# Patient Record
Sex: Female | Born: 1960 | Race: Black or African American | Hispanic: No | Smoking: Never smoker
Health system: Southern US, Community
[De-identification: ages and names within clinical notes are randomized; demographics above are authoritative.]

## PROBLEM LIST (undated history)

## (undated) DIAGNOSIS — F329 Major depressive disorder, single episode, unspecified: Secondary | ICD-10-CM

## (undated) DIAGNOSIS — K859 Acute pancreatitis without necrosis or infection, unspecified: Secondary | ICD-10-CM

## (undated) DIAGNOSIS — E669 Obesity, unspecified: Secondary | ICD-10-CM

## (undated) DIAGNOSIS — F32A Depression, unspecified: Secondary | ICD-10-CM

## (undated) DIAGNOSIS — E538 Deficiency of other specified B group vitamins: Secondary | ICD-10-CM

## (undated) DIAGNOSIS — K219 Gastro-esophageal reflux disease without esophagitis: Secondary | ICD-10-CM

## (undated) DIAGNOSIS — G4700 Insomnia, unspecified: Secondary | ICD-10-CM

## (undated) DIAGNOSIS — E039 Hypothyroidism, unspecified: Secondary | ICD-10-CM

## (undated) HISTORY — PX: APPENDECTOMY: SHX54

## (undated) HISTORY — DX: Insomnia, unspecified: G47.00

## (undated) HISTORY — DX: Hypothyroidism, unspecified: E03.9

## (undated) HISTORY — DX: Gastro-esophageal reflux disease without esophagitis: K21.9

## (undated) HISTORY — DX: Deficiency of other specified B group vitamins: E53.8

## (undated) HISTORY — DX: Major depressive disorder, single episode, unspecified: F32.9

## (undated) HISTORY — DX: Acute pancreatitis without necrosis or infection, unspecified: K85.90

## (undated) HISTORY — DX: Obesity, unspecified: E66.9

## (undated) HISTORY — PX: CHOLECYSTECTOMY: SHX55

## (undated) HISTORY — DX: Depression, unspecified: F32.A

---

## 2001-01-19 ENCOUNTER — Encounter: Payer: Self-pay | Admitting: Emergency Medicine

## 2001-01-19 ENCOUNTER — Inpatient Hospital Stay (HOSPITAL_COMMUNITY): Admission: EM | Admit: 2001-01-19 | Discharge: 2001-01-22 | Payer: Self-pay | Admitting: Emergency Medicine

## 2001-02-08 ENCOUNTER — Ambulatory Visit (HOSPITAL_COMMUNITY): Admission: RE | Admit: 2001-02-08 | Discharge: 2001-02-08 | Payer: Self-pay | Admitting: Surgery

## 2001-02-08 ENCOUNTER — Encounter: Payer: Self-pay | Admitting: Surgery

## 2001-02-23 ENCOUNTER — Encounter (INDEPENDENT_AMBULATORY_CARE_PROVIDER_SITE_OTHER): Payer: Self-pay

## 2001-02-24 ENCOUNTER — Inpatient Hospital Stay (HOSPITAL_COMMUNITY): Admission: EM | Admit: 2001-02-24 | Discharge: 2001-02-25 | Payer: Self-pay | Admitting: Surgery

## 2003-04-29 ENCOUNTER — Emergency Department (HOSPITAL_COMMUNITY): Admission: EM | Admit: 2003-04-29 | Discharge: 2003-04-29 | Payer: Self-pay | Admitting: *Deleted

## 2004-08-11 ENCOUNTER — Ambulatory Visit: Payer: Self-pay | Admitting: Internal Medicine

## 2004-08-15 ENCOUNTER — Ambulatory Visit: Payer: Self-pay | Admitting: Internal Medicine

## 2005-07-01 ENCOUNTER — Ambulatory Visit: Payer: Self-pay | Admitting: Internal Medicine

## 2006-09-14 ENCOUNTER — Ambulatory Visit: Payer: Self-pay | Admitting: Internal Medicine

## 2006-09-14 LAB — CONVERTED CEMR LAB
ALT: 19 units/L (ref 0–35)
AST: 17 units/L (ref 0–37)
Albumin: 3.1 g/dL — ABNORMAL LOW (ref 3.5–5.2)
Alkaline Phosphatase: 74 units/L (ref 39–117)
BUN: 8 mg/dL (ref 6–23)
Basophils Absolute: 0 10*3/uL (ref 0.0–0.1)
Basophils Relative: 0 % (ref 0.0–1.0)
Bilirubin Urine: NEGATIVE
Bilirubin, Direct: 0.1 mg/dL (ref 0.0–0.3)
CO2: 31 meq/L (ref 19–32)
Calcium: 8.9 mg/dL (ref 8.4–10.5)
Chloride: 108 meq/L (ref 96–112)
Creatinine, Ser: 0.6 mg/dL (ref 0.4–1.2)
Eosinophils Absolute: 0.1 10*3/uL (ref 0.0–0.6)
Eosinophils Relative: 0.6 % (ref 0.0–5.0)
FSH: 6.8 milliintl units/mL
GFR calc Af Amer: 138 mL/min
GFR calc non Af Amer: 114 mL/min
Glucose, Bld: 103 mg/dL — ABNORMAL HIGH (ref 70–99)
HCT: 31.3 % — ABNORMAL LOW (ref 36.0–46.0)
Hemoglobin, Urine: NEGATIVE
Hemoglobin: 10.5 g/dL — ABNORMAL LOW (ref 12.0–15.0)
Ketones, ur: NEGATIVE mg/dL
Leukocytes, UA: NEGATIVE
Lymphocytes Relative: 42.9 % (ref 12.0–46.0)
MCHC: 33.5 g/dL (ref 30.0–36.0)
MCV: 81.7 fL (ref 78.0–100.0)
Monocytes Absolute: 1.3 10*3/uL — ABNORMAL HIGH (ref 0.2–0.7)
Monocytes Relative: 12.4 % — ABNORMAL HIGH (ref 3.0–11.0)
Neutro Abs: 4.5 10*3/uL (ref 1.4–7.7)
Neutrophils Relative %: 44.1 % (ref 43.0–77.0)
Nitrite: NEGATIVE
Platelets: 707 10*3/uL — ABNORMAL HIGH (ref 150–400)
Potassium: 3.8 meq/L (ref 3.5–5.1)
RBC: 3.83 M/uL — ABNORMAL LOW (ref 3.87–5.11)
RDW: 18.7 % — ABNORMAL HIGH (ref 11.5–14.6)
Sodium: 141 meq/L (ref 135–145)
Specific Gravity, Urine: 1.025 (ref 1.000–1.03)
TSH: 4.26 microintl units/mL (ref 0.35–5.50)
Total Bilirubin: 0.5 mg/dL (ref 0.3–1.2)
Total Protein, Urine: NEGATIVE mg/dL
Total Protein: 7.1 g/dL (ref 6.0–8.3)
Urine Glucose: NEGATIVE mg/dL
Urobilinogen, UA: 0.2 (ref 0.0–1.0)
WBC: 10.3 10*3/uL (ref 4.5–10.5)
pH: 6 (ref 5.0–8.0)

## 2007-03-10 DIAGNOSIS — E039 Hypothyroidism, unspecified: Secondary | ICD-10-CM

## 2007-03-10 DIAGNOSIS — E538 Deficiency of other specified B group vitamins: Secondary | ICD-10-CM

## 2007-03-10 HISTORY — DX: Deficiency of other specified B group vitamins: E53.8

## 2007-03-10 HISTORY — DX: Hypothyroidism, unspecified: E03.9

## 2007-05-20 ENCOUNTER — Encounter: Payer: Self-pay | Admitting: Internal Medicine

## 2007-06-14 ENCOUNTER — Ambulatory Visit: Payer: Self-pay | Admitting: Internal Medicine

## 2007-06-14 DIAGNOSIS — F32A Depression, unspecified: Secondary | ICD-10-CM | POA: Insufficient documentation

## 2007-06-14 DIAGNOSIS — F329 Major depressive disorder, single episode, unspecified: Secondary | ICD-10-CM

## 2007-06-14 DIAGNOSIS — E538 Deficiency of other specified B group vitamins: Secondary | ICD-10-CM | POA: Insufficient documentation

## 2007-06-14 DIAGNOSIS — R5383 Other fatigue: Secondary | ICD-10-CM

## 2007-06-14 DIAGNOSIS — R209 Unspecified disturbances of skin sensation: Secondary | ICD-10-CM

## 2007-06-14 DIAGNOSIS — R5381 Other malaise: Secondary | ICD-10-CM

## 2007-06-14 DIAGNOSIS — R635 Abnormal weight gain: Secondary | ICD-10-CM

## 2007-06-15 DIAGNOSIS — E039 Hypothyroidism, unspecified: Secondary | ICD-10-CM | POA: Insufficient documentation

## 2007-06-16 LAB — CONVERTED CEMR LAB
ALT: 14 units/L (ref 0–35)
AST: 18 units/L (ref 0–37)
Albumin: 3.4 g/dL — ABNORMAL LOW (ref 3.5–5.2)
Alkaline Phosphatase: 67 units/L (ref 39–117)
BUN: 7 mg/dL (ref 6–23)
Basophils Absolute: 0 10*3/uL (ref 0.0–0.1)
Basophils Relative: 0.1 % (ref 0.0–1.0)
Bilirubin Urine: NEGATIVE
Bilirubin, Direct: 0.1 mg/dL (ref 0.0–0.3)
CO2: 29 meq/L (ref 19–32)
Calcium: 8.6 mg/dL (ref 8.4–10.5)
Chloride: 106 meq/L (ref 96–112)
Creatinine, Ser: 0.7 mg/dL (ref 0.4–1.2)
Eosinophils Absolute: 0.1 10*3/uL (ref 0.0–0.7)
Eosinophils Relative: 1.3 % (ref 0.0–5.0)
GFR calc Af Amer: 115 mL/min
GFR calc non Af Amer: 95 mL/min
Glucose, Bld: 87 mg/dL (ref 70–99)
HCT: 30.9 % — ABNORMAL LOW (ref 36.0–46.0)
Hemoglobin, Urine: NEGATIVE
Hemoglobin: 9.8 g/dL — ABNORMAL LOW (ref 12.0–15.0)
Ketones, ur: NEGATIVE mg/dL
Leukocytes, UA: NEGATIVE
Lymphocytes Relative: 62.2 % — ABNORMAL HIGH (ref 12.0–46.0)
MCHC: 31.8 g/dL (ref 30.0–36.0)
MCV: 80.5 fL (ref 78.0–100.0)
Monocytes Absolute: 0.8 10*3/uL (ref 0.1–1.0)
Monocytes Relative: 8.8 % (ref 3.0–12.0)
Neutro Abs: 2.8 10*3/uL (ref 1.4–7.7)
Neutrophils Relative %: 27.6 % — ABNORMAL LOW (ref 43.0–77.0)
Nitrite: NEGATIVE
Platelets: 717 10*3/uL — ABNORMAL HIGH (ref 150–400)
Potassium: 3.3 meq/L — ABNORMAL LOW (ref 3.5–5.1)
RBC: 3.84 M/uL — ABNORMAL LOW (ref 3.87–5.11)
RDW: 17.4 % — ABNORMAL HIGH (ref 11.5–14.6)
Sodium: 138 meq/L (ref 135–145)
Specific Gravity, Urine: 1.02 (ref 1.000–1.03)
TSH: 5.43 microintl units/mL (ref 0.35–5.50)
Total Bilirubin: 0.5 mg/dL (ref 0.3–1.2)
Total Protein: 7.7 g/dL (ref 6.0–8.3)
Urine Glucose: NEGATIVE mg/dL
Urobilinogen, UA: 0.2 (ref 0.0–1.0)
Vitamin B-12: 147 pg/mL — ABNORMAL LOW (ref 211–911)
WBC: 10.2 10*3/uL (ref 4.5–10.5)
pH: 7 (ref 5.0–8.0)

## 2007-06-20 ENCOUNTER — Ambulatory Visit: Payer: Self-pay | Admitting: Internal Medicine

## 2007-06-20 DIAGNOSIS — E559 Vitamin D deficiency, unspecified: Secondary | ICD-10-CM

## 2007-09-06 ENCOUNTER — Ambulatory Visit: Payer: Self-pay | Admitting: Internal Medicine

## 2007-12-12 ENCOUNTER — Ambulatory Visit: Payer: Self-pay | Admitting: Internal Medicine

## 2007-12-12 LAB — CONVERTED CEMR LAB
BUN: 7 mg/dL (ref 6–23)
CO2: 30 meq/L (ref 19–32)
Calcium: 9 mg/dL (ref 8.4–10.5)
Chloride: 106 meq/L (ref 96–112)
Creatinine, Ser: 0.7 mg/dL (ref 0.4–1.2)
GFR calc non Af Amer: 95 mL/min

## 2008-04-18 ENCOUNTER — Encounter: Payer: Self-pay | Admitting: Internal Medicine

## 2008-05-01 ENCOUNTER — Telehealth: Payer: Self-pay | Admitting: Internal Medicine

## 2008-05-24 ENCOUNTER — Telehealth: Payer: Self-pay | Admitting: Internal Medicine

## 2008-08-09 ENCOUNTER — Emergency Department (HOSPITAL_COMMUNITY): Admission: EM | Admit: 2008-08-09 | Discharge: 2008-08-09 | Payer: Self-pay | Admitting: Emergency Medicine

## 2008-09-05 ENCOUNTER — Ambulatory Visit: Payer: Self-pay | Admitting: Internal Medicine

## 2008-09-05 DIAGNOSIS — K047 Periapical abscess without sinus: Secondary | ICD-10-CM | POA: Insufficient documentation

## 2008-09-17 ENCOUNTER — Telehealth: Payer: Self-pay | Admitting: Internal Medicine

## 2008-10-08 ENCOUNTER — Encounter: Payer: Self-pay | Admitting: Internal Medicine

## 2009-03-09 DIAGNOSIS — K859 Acute pancreatitis without necrosis or infection, unspecified: Secondary | ICD-10-CM

## 2009-03-09 HISTORY — DX: Acute pancreatitis without necrosis or infection, unspecified: K85.90

## 2009-03-28 ENCOUNTER — Ambulatory Visit: Payer: Self-pay | Admitting: Internal Medicine

## 2009-03-28 DIAGNOSIS — R079 Chest pain, unspecified: Secondary | ICD-10-CM

## 2009-03-28 DIAGNOSIS — R29818 Other symptoms and signs involving the nervous system: Secondary | ICD-10-CM | POA: Insufficient documentation

## 2009-07-01 ENCOUNTER — Ambulatory Visit: Payer: Self-pay | Admitting: Internal Medicine

## 2009-07-01 DIAGNOSIS — Z8719 Personal history of other diseases of the digestive system: Secondary | ICD-10-CM | POA: Insufficient documentation

## 2009-07-01 DIAGNOSIS — K219 Gastro-esophageal reflux disease without esophagitis: Secondary | ICD-10-CM

## 2009-07-03 LAB — CONVERTED CEMR LAB
ALT: 30 units/L (ref 0–35)
AST: 29 units/L (ref 0–37)
Albumin: 3.4 g/dL — ABNORMAL LOW (ref 3.5–5.2)
Basophils Relative: 0.5 % (ref 0.0–3.0)
Eosinophils Relative: 1.7 % (ref 0.0–5.0)
HCT: 31.6 % — ABNORMAL LOW (ref 36.0–46.0)
Lipase: 2 units/L — ABNORMAL LOW (ref 11.0–59.0)
Lymphs Abs: 4 10*3/uL (ref 0.7–4.0)
MCV: 80.8 fL (ref 78.0–100.0)
Monocytes Absolute: 0.8 10*3/uL (ref 0.1–1.0)
RBC: 3.91 M/uL (ref 3.87–5.11)
Total Protein: 7.3 g/dL (ref 6.0–8.3)
WBC: 7.7 10*3/uL (ref 4.5–10.5)

## 2009-07-09 ENCOUNTER — Telehealth: Payer: Self-pay | Admitting: Internal Medicine

## 2009-07-09 DIAGNOSIS — L709 Acne, unspecified: Secondary | ICD-10-CM | POA: Insufficient documentation

## 2009-07-16 ENCOUNTER — Encounter: Payer: Self-pay | Admitting: Internal Medicine

## 2009-07-17 ENCOUNTER — Encounter: Payer: Self-pay | Admitting: Internal Medicine

## 2009-09-13 ENCOUNTER — Ambulatory Visit: Payer: Self-pay | Admitting: Internal Medicine

## 2010-04-08 NOTE — Progress Notes (Signed)
Summary: Treinoin Cream PA  Phone Note From Pharmacy   Details of Request: ID:  [id number] Summary of Call: PA request--Tretinoin. Form requested. Initial call taken by: Lucious Groves,  Jul 09, 2009 9:49 AM  Follow-up for Phone Call        prior authorization completed and faxed, will await insurance company reply. Follow-up by: Lucious Groves,  Jul 11, 2009 11:59 AM  New Problems: ACNE VULGARIS, FACIAL (ICD-706.1)   New Problems: ACNE VULGARIS, FACIAL (ICD-706.1)     Appended Document: Treinoin Cream PA approved until 2012.

## 2010-04-08 NOTE — Medication Information (Signed)
Summary: Prior Authorization [employer] / [employer]  Prior Authorization [employer] / [employer]   Imported By: Lennie Odor 07/16/2009 15:34:04  _____________________________________________________________________  External Attachment:    Type:   Image     Comment:   External Document

## 2010-04-08 NOTE — Assessment & Plan Note (Signed)
Summary: post hospital/pancreatitis/ok per plot/lb   Vital Signs:  Patient profile:   50 year old female Height:      66.5 inches Weight:      258 pounds BMI:     41.17 Temp:     97.8 degrees F oral Pulse rate:   99 / minute BP sitting:   122 / 84  (right arm)  Vitals Entered By: Lamar Sprinkles, CMA (July 01, 2009 8:08 AM) CC: F/U   Primary Care Provider:  Tresa Garter MD  CC:  F/U.  History of Present Illness: She was diagnosed w/pancreatitis in [place] 3 wks ago. She had a CT of abd - no GS. C/o stress and depression. C/o L flank pain. She was given Vicodin. Pain is moderate - worse with ETOH, fried food. Took  a lot of Motrin. Was having 2 drinks on Fridays.  Current Medications (verified): 1)  Fluoxetine Hcl 20 Mg  Tabs (Fluoxetine Hcl) .... Take 1 Tab By Mouth Daily 2)  Vitamin D3 1000 Unit  Tabs (Cholecalciferol) .Marland Kitchen.. 1 By Mouth Daily 3)  Vitamin B-12 Cr 1000 Mcg  Tbcr (Cyanocobalamin) .... 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  The patient denies fever, dyspnea on exertion, and abdominal pain.    Physical Exam  General:   in no acute distress overweight-appearing.   Nose:  no external deformity, no external erythema, and no nasal discharge.   Mouth:  Oral mucosa and oropharynx without lesions or exudates.  Teeth in good repair. Lungs:  normal respiratory effort, no intercostal  retractions, no accessory muscle use, normal breath sounds, no dullness, no fremitus, no crackles, and no wheezes.   Heart:  normal rate, regular rhythm, no murmur, no gallop, no rub, and no JVD.   Abdomen:  soft, non-tender, normal bowel sounds, no distention, no masses, no guarding, no rigidity, no hepatomegaly, and no splenomegaly.   Msk:  normal ROM, no joint tenderness, no joint swelling, no joint warmth, no redness over joints, no joint deformities, no joint instability, and no crepitation.   Neurologic:  No cranial nerve deficits noted. Station and gait are normal. Plantar reflexes are down-going bilaterally. DTRs are symmetrical throughout. Sensory, motor and coordinative functions appear intact. Skin:  turgor normal, color normal, no rashes, no suspicious lesions, no ecchymoses, no petechiae, no purpura, no ulcerations, and no edema.   Psych:  Oriented X3, memory intact for recent and remote, normally interactive, good eye contact, not anxious appearing, and not depressed appearing.     Impression & Recommendations:  Problem # 1:  PANCREATITIS, HX OF (ICD-V12.70) Assessment New She had a CT. No GS. No ETOH lately.  Problem # 2:  WEIGHT GAIN, ABNORMAL (ICD-783.1) Assessment: Deteriorated  Orders: TLB-CBC Platelet - w/Differential (85025-CBCD) TLB-B12, Serum-Total ONLY (13086-V78) TLB-Hepatic/Liver Function Pnl (80076-HEPATIC) TLB-Lipase (83690-LIPASE) TLB-TSH (Thyroid Stimulating Hormone) (84443-TSH) T-Vitamin D (25-Hydroxy) (46962-95284) T-Helicobacter AB - IgG (13244-01027)  Problem # 3:  VITAMIN D DEFICIENCY (ICD-268.9) Assessment: Unchanged On prescription/OTC  therapy  Problem # 4:  B12 DEFICIENCY (ICD-266.2) Assessment: Deteriorated On prescription/OTC  therapy  Orders: TLB-CBC Platelet - w/Differential (85025-CBCD) TLB-B12, Serum-Total ONLY (16109-U04) TLB-Hepatic/Liver Function Pnl (80076-HEPATIC) TLB-Lipase (83690-LIPASE) TLB-TSH (Thyroid Stimulating Hormone)  (84443-TSH) T-Vitamin D (25-Hydroxy) (54098-11914) T-Helicobacter AB - IgG (78295-62130) Admin of Therapeutic Inj  intramuscular or subcutaneous (86578) Vit B12 1000 mcg (J3420)  Problem # 5:  HYPOTHYROIDISM (ICD-244.9) Assessment: Comment Only  Problem # 6:  GERD (ICD-530.81) Assessment: Deteriorated  Her updated medication list for this problem includes:    Protonix 40 Mg Tbec (Pantoprazole sodium) .Marland Kitchen... 1 by mouth qam for indigestion  Orders: TLB-CBC Platelet - w/Differential (85025-CBCD) TLB-B12, Serum-Total ONLY (46962-X52) TLB-Hepatic/Liver Function Pnl (80076-HEPATIC) TLB-Lipase (83690-LIPASE) TLB-TSH (Thyroid Stimulating Hormone) (84443-TSH) T-Vitamin D (25-Hydroxy) (84132-44010) T-Helicobacter AB - IgG (27253-66440)  Complete Medication List: 1)  Fluoxetine Hcl 20 Mg Tabs (Fluoxetine hcl) .... Take 1 tab by mouth daily 2)  Vitamin D3 1000 Unit Tabs (Cholecalciferol) .Marland Kitchen.. 1 by mouth daily 3)  Vitamin B-12 Cr 1000 Mcg Tbcr (Cyanocobalamin) .... Take one tablet by mouth daily 4)  Tramadol Hcl 50 Mg Tabs (Tramadol hcl) .Marland Kitchen.. 1-2 tabs by mouth two times a day as needed pain 5)  Protonix 40 Mg Tbec (Pantoprazole sodium) .Marland Kitchen.. 1 by mouth qam for indigestion 6)  Tretinoin 0.025 % Crea (Tretinoin) .... Use once daily on face  Patient Instructions: 1)  Please schedule a follow-up appointment in 6 months. 2)  See a gastroenterolodist 3)  No alcohol 4)  Loose weight. Low fat diet. 5)  Try to eat more raw plant food, fresh and dry fruit, raw almonds, leafy vegetables, whole foods and less red meat, less animal fat. Poultry and fish is better for you than pork and beef. Avoid processed foods (canned soups, hot dogs, sausage, bacon , frozen dinners). Avoid corn syrup, high fructose syrup or aspartam and Splenda  containing drinks. Honey, Agave and Stevia are better sweeteners. Make your own  dressing with olive oil, wine vinegar, lemon juce, garlic etc. for your salads.   Prescriptions: FLUOXETINE HCL 20 MG  TABS (FLUOXETINE HCL) Take 1 tab by mouth daily  #90 Capsule x 1   Entered and Authorized by:   Tresa Garter MD   Signed by:   Tresa Garter MD on 07/01/2009   Method used:   Print then Give to Patient   RxID:   [id number] TRETINOIN 0.025 % CREA (TRETINOIN) use once daily on face  #45 g x 6   Entered and Authorized by:   Tresa Garter MD   Signed by:   Tresa Garter MD on 07/01/2009   Method used:   Print then Give to Patient   RxID:   [id number] PROTONIX 40 MG TBEC (PANTOPRAZOLE SODIUM) 1 by mouth qam for indigestion  #90 x 3   Entered and Authorized by:   Tresa Garter MD   Signed by:   Tresa Garter MD on 07/01/2009   Method used:   Print then Give to Patient   RxID:   [id number] TRAMADOL HCL 50 MG TABS (TRAMADOL HCL) 1-2 tabs by mouth two times a day as needed pain  #120 x 3   Entered and Authorized by:   Tresa Garter MD   Signed by:   Tresa Garter MD on 07/01/2009   Method used:   Print then Give to Patient   RxID:   [id number]     Not Administered:    Influenza Vaccine not given due  to: declined   Medication Administration  Injection # 1:    Medication: Vit B12 1000 mcg    Diagnosis: B12 DEFICIENCY (ICD-266.2)    Route: IM    Site: L deltoid    Exp Date: 03/10/2011    Lot #: [id number]    Mfr: [employer]    Patient tolerated injection without complications    Given by: Lamar Sprinkles, CMA (July 01, 2009 10:14 AM)  Orders Added: 1)  TLB-CBC Platelet - w/Differential [85025-CBCD] 2)  TLB-B12, Serum-Total ONLY [82607-B12] 3)  TLB-Hepatic/Liver Function Pnl [80076-HEPATIC] 4)  TLB-Lipase [83690-LIPASE] 5)  TLB-TSH (Thyroid Stimulating Hormone) [84443-TSH] 6)  T-Vitamin D (25-Hydroxy) [52841-32440] 7)  T-Helicobacter AB - IgG [10272-53664] 8)  Admin of Therapeutic Inj  intramuscular or subcutaneous [96372] 9)  Vit B12 1000 mcg [J3420] 10)  Est. Patient  Level IV [40347]
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
## 2010-04-08 NOTE — Medication Information (Signed)
Summary: Approved/[employer]  Approved/[employer]   Imported By: Lester  07/22/2009 08:12:47  _____________________________________________________________________  External Attachment:    Type:   Image     Comment:   External Document

## 2010-05-26 ENCOUNTER — Encounter: Payer: Self-pay | Admitting: Internal Medicine

## 2010-05-26 ENCOUNTER — Other Ambulatory Visit (INDEPENDENT_AMBULATORY_CARE_PROVIDER_SITE_OTHER): Payer: BC Managed Care – PPO | Admitting: Internal Medicine

## 2010-05-26 ENCOUNTER — Ambulatory Visit (INDEPENDENT_AMBULATORY_CARE_PROVIDER_SITE_OTHER): Payer: BC Managed Care – PPO | Admitting: Internal Medicine

## 2010-05-26 DIAGNOSIS — E559 Vitamin D deficiency, unspecified: Secondary | ICD-10-CM

## 2010-05-26 DIAGNOSIS — F411 Generalized anxiety disorder: Secondary | ICD-10-CM | POA: Insufficient documentation

## 2010-05-26 DIAGNOSIS — E039 Hypothyroidism, unspecified: Secondary | ICD-10-CM

## 2010-05-26 DIAGNOSIS — E538 Deficiency of other specified B group vitamins: Secondary | ICD-10-CM

## 2010-05-26 DIAGNOSIS — N920 Excessive and frequent menstruation with regular cycle: Secondary | ICD-10-CM

## 2010-05-26 DIAGNOSIS — F419 Anxiety disorder, unspecified: Secondary | ICD-10-CM | POA: Insufficient documentation

## 2010-05-26 DIAGNOSIS — J069 Acute upper respiratory infection, unspecified: Secondary | ICD-10-CM | POA: Insufficient documentation

## 2010-05-27 ENCOUNTER — Ambulatory Visit: Payer: BC Managed Care – PPO

## 2010-05-27 DIAGNOSIS — F411 Generalized anxiety disorder: Secondary | ICD-10-CM

## 2010-05-27 DIAGNOSIS — E039 Hypothyroidism, unspecified: Secondary | ICD-10-CM

## 2010-05-27 DIAGNOSIS — N920 Excessive and frequent menstruation with regular cycle: Secondary | ICD-10-CM

## 2010-05-27 DIAGNOSIS — E559 Vitamin D deficiency, unspecified: Secondary | ICD-10-CM

## 2010-05-27 DIAGNOSIS — E538 Deficiency of other specified B group vitamins: Secondary | ICD-10-CM

## 2010-05-27 LAB — CBC WITH DIFFERENTIAL/PLATELET
Basophils Absolute: 0.4 10*3/uL — ABNORMAL HIGH (ref 0.0–0.1)
Basophils Relative: 4.9 % — ABNORMAL HIGH (ref 0.0–3.0)
Eosinophils Absolute: 0.1 10*3/uL (ref 0.0–0.7)
Lymphocytes Relative: 41.1 % (ref 12.0–46.0)
MCHC: 32.4 g/dL (ref 30.0–36.0)
MCV: 74.1 fl — ABNORMAL LOW (ref 78.0–100.0)
Monocytes Absolute: 1 10*3/uL (ref 0.1–1.0)
Neutro Abs: 3.3 10*3/uL (ref 1.4–7.7)
Neutrophils Relative %: 40.6 % — ABNORMAL LOW (ref 43.0–77.0)
RBC: 4.02 Mil/uL (ref 3.87–5.11)
RDW: 19.3 % — ABNORMAL HIGH (ref 11.5–14.6)

## 2010-05-27 LAB — HEPATIC FUNCTION PANEL
Albumin: 3.4 g/dL — ABNORMAL LOW (ref 3.5–5.2)
Bilirubin, Direct: 0.1 mg/dL (ref 0.0–0.3)
Total Protein: 7.7 g/dL (ref 6.0–8.3)

## 2010-05-27 LAB — BASIC METABOLIC PANEL
CO2: 27 mEq/L (ref 19–32)
Calcium: 8.7 mg/dL (ref 8.4–10.5)
Chloride: 106 mEq/L (ref 96–112)
Creatinine, Ser: 0.8 mg/dL (ref 0.4–1.2)
Glucose, Bld: 97 mg/dL (ref 70–99)

## 2010-05-27 LAB — VITAMIN B12: Vitamin B-12: 703 pg/mL (ref 211–911)

## 2010-05-27 LAB — TSH: TSH: 4.06 u[IU]/mL (ref 0.35–5.50)

## 2010-05-28 ENCOUNTER — Telehealth: Payer: Self-pay | Admitting: *Deleted

## 2010-05-28 ENCOUNTER — Telehealth: Payer: Self-pay | Admitting: Internal Medicine

## 2010-05-28 ENCOUNTER — Other Ambulatory Visit: Payer: Self-pay | Admitting: Internal Medicine

## 2010-05-28 LAB — URINALYSIS, ROUTINE W REFLEX MICROSCOPIC
Specific Gravity, Urine: 1.02 (ref 1.000–1.030)
Total Protein, Urine: NEGATIVE
Urine Glucose: NEGATIVE
pH: 6 (ref 5.0–8.0)

## 2010-05-28 MED ORDER — ERGOCALCIFEROL 1.25 MG (50000 UT) PO CAPS: 50000.0000 [IU] | ORAL_CAPSULE | ORAL | Status: DC

## 2010-05-28 NOTE — Telephone Encounter (Signed)
See Vit D results....unable to contact pt.

## 2010-05-28 NOTE — Telephone Encounter (Signed)
Tried to call pt to advise vit D is low and to inform new rx was sent in. No working numbers for patient.

## 2010-05-29 ENCOUNTER — Telehealth: Payer: Self-pay | Admitting: *Deleted

## 2010-05-29 NOTE — Telephone Encounter (Signed)
Message copied by Lanier Prude on Thu May 29, 2010  8:12 AM ------      Message from: Sonda Primes      Created: Wed May 28, 2010  5:47 PM       Stace 2 hery please inform the patient that her labs are normal except for low vitamin D I think you have already a cold in vitamin D for her       thank you

## 2010-05-29 NOTE — Telephone Encounter (Signed)
Multiple unsuccessful attempts to contact pt.Vit D sent in previously per Dr. Posey Rea. Will attempt to contact pt again later.Marland KitchenMarland KitchenI tried calling pt's work, cell and home # and her emergency contact #.

## 2010-05-30 NOTE — Telephone Encounter (Signed)
Attempted to contact pt again today... Still unable.  Will mail letter to pt re: recent labs

## 2010-06-05 NOTE — Assessment & Plan Note (Signed)
Summary: last of 2011/requests anxiety med/#/cd (mother made appt--not...   Vital Signs:  Patient profile:   50 year old female Height:      66.5 inches Weight:      283 pounds BMI:     45.16 Temp:     100.5 degrees F oral Pulse rate:   92 / minute Pulse rhythm:   regular Resp:     16 per minute BP sitting:   114 / 74  (left arm) Cuff size:   large  Vitals Entered By: Lanier Prude, Beverly Gust) (May 26, 2010 4:59 PM) CC: c/o fatigue, anxiety and stress Is Patient Diabetic? No Comments she is req rf on Fluoxetine and Lorazepam   Primary Care Provider:  Georgina Quint Plotnikov MD  CC:  c/o fatigue and anxiety and stress.  History of Present Illness: The patient presents for a follow up of back pain, anxiety, vit B12, depression and headaches. C/o stress.  The patient presents with complaints of sore throat, fever, cough, sinus congestion and drainge of several days duration. Not better with OTC meds. Chest hurts with coughing. Can't sleep due to cough. Muscle aches are present.  The mucus is colored.  C/o irreg period now after 3 wks of no period  Current Medications (verified): 1)  Fluoxetine Hcl 20 Mg  Tabs (Fluoxetine Hcl) .... Take 1 Tab By Mouth Daily 2)  Vitamin D3 1000 Unit  Tabs (Cholecalciferol) .Marland Kitchen.. 1 By Mouth Daily 3)  Vitamin B-12 Cr 1000 Mcg  Tbcr (Cyanocobalamin) .... Take One Tablet By Mouth Daily 4)  Tramadol Hcl 50 Mg Tabs (Tramadol Hcl) .Marland Kitchen.. 1-2 Tabs By Mouth Two Times A Day As Needed Pain 5)  Protonix 40 Mg Tbec (Pantoprazole Sodium) .Marland Kitchen.. 1 By Mouth Qam For Indigestion 6)  Tretinoin 0.025 % Crea (Tretinoin) .... 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    Physical Exam  General:   in no acute distress overweight-appearing.   Ears:  R ear normal and L ear normal.   Nose:  no external deformity, no external erythema, and no nasal discharge.   Mouth:  Oral mucosa and oropharynx without lesions or exudates.  Teeth in good repair. Lungs:  normal respiratory effort, no intercostal retractions, no accessory muscle use, normal breath sounds, no dullness, no fremitus, no crackles, and no wheezes.   Heart:  normal rate, regular rhythm, no murmur, no gallop, no rub, and no JVD.   Abdomen:  soft, non-tender, normal bowel sounds, no distention, no masses, no guarding, no rigidity, no hepatomegaly, and no splenomegaly.   Msk:  normal ROM, no joint tenderness, no joint swelling, no joint warmth, no redness over joints, no joint deformities, no joint instability, and no crepitation.   Extremities:  No clubbing, cyanosis, edema, or deformity noted with normal full range of motion of all joints.   Neurologic:  No cranial nerve deficits noted.  Station and gait are normal. Plantar reflexes are down-going bilaterally. DTRs are symmetrical throughout. Sensory, motor and coordinative functions appear intact. Skin:  turgor normal, color normal, no rashes, no suspicious lesions, no ecchymoses, no petechiae, no purpura, no ulcerations, and no edema.   Psych:  Oriented X3, memory intact for recent and remote, normally interactive, good eye contact, not anxious appearing, and not depressed appearing.  not suicidal.     Impression &  Recommendations:  Problem # 1:  B12 DEFICIENCY (ICD-266.2) Assessment Deteriorated  Risks of noncompliance with treatment discussed. Compliance encouraged.   Orders: TLB-B12, Serum-Total ONLY (16109-U04) TLB-BMP (Basic Metabolic Panel-BMET) (80048-METABOL) TLB-CBC Platelet - w/Differential (85025-CBCD) TLB-Hepatic/Liver Function Pnl (80076-HEPATIC) TLB-Sedimentation Rate (ESR) (85652-ESR) TLB-TSH (Thyroid Stimulating Hormone) (84443-TSH) T-Vitamin D (25-Hydroxy) (54098-11914) TLB-Udip ONLY (81003-UDIP)  Problem # 2:  HYPOTHYROIDISM (ICD-244.9) Assessment: Comment Only  Orders: TLB-B12, Serum-Total ONLY (78295-A21) TLB-BMP (Basic Metabolic Panel-BMET) (80048-METABOL) TLB-CBC Platelet - w/Differential (85025-CBCD) TLB-Hepatic/Liver Function Pnl (80076-HEPATIC) TLB-Sedimentation Rate (ESR) (85652-ESR) TLB-TSH (Thyroid Stimulating Hormone) (84443-TSH) T-Vitamin D (25-Hydroxy) (30865-78469) TLB-Udip ONLY (81003-UDIP)  Problem # 3:  MUSCULOSKELETAL PAIN (ICD-781.99) Assessment: Unchanged On the regimen of medicine(s) reflected in the chart    Problem # 4:  DEPRESSION (ICD-311) Assessment: Deteriorated  Her updated medication list for this problem includes:    Fluoxetine Hcl 20 Mg Tabs (Fluoxetine hcl) .Marland Kitchen... Take 1 tab by mouth daily    Lorazepam 1 Mg Tabs (Lorazepam) .Marland Kitchen... 1/2 or 1 by mouth two times a day as needed anxiety  Problem # 5:  UPPER RESPIRATORY INFECTION, ACUTE (ICD-465.9) Assessment: Comment Only On the regimen of medicine(s) reflected in the chart    Problem # 6:  MENORRHAGIA (ICD-626.2) Assessment: New  Orders: TLB-B12, Serum-Total ONLY (62952-W41) TLB-BMP (Basic Metabolic Panel-BMET) (80048-METABOL) TLB-CBC Platelet - w/Differential (85025-CBCD) TLB-Hepatic/Liver Function Pnl (80076-HEPATIC) TLB-Sedimentation Rate (ESR) (85652-ESR) TLB-TSH (Thyroid Stimulating Hormone) (84443-TSH) T-Vitamin D (25-Hydroxy) (32440-10272) TLB-Udip ONLY  (81003-UDIP)  Complete Medication List: 1)  Fluoxetine Hcl 20 Mg Tabs (Fluoxetine hcl) .... Take 1 tab by mouth daily 2)  Vitamin D3 1000 Unit Tabs (Cholecalciferol) .Marland Kitchen.. 1 by mouth daily 3)  Vitamin B-12 Cr 1000 Mcg Tbcr (Cyanocobalamin) .... Take one tablet by mouth daily 4)  Tramadol Hcl 50 Mg Tabs (Tramadol hcl) .Marland Kitchen.. 1-2 tabs by mouth two times a day as needed pain 5)  Protonix 40 Mg Tbec (Pantoprazole sodium) .Marland Kitchen.. 1 by mouth qam for indigestion 6)  Tretinoin 0.025 % Crea (Tretinoin) .... Use once daily on face 7)  Lorazepam 1 Mg Tabs (Lorazepam) .... 1/2 or 1 by mouth two times a day as needed anxiety 8)  Amoxicillin 500 Mg Caps (Amoxicillin) .... 2 caps by mouth bid  Patient Instructions: 1)  Use over-the-counter medicines for "cold": Tylenol  650mg  or Advil 400mg  every 6 hours  for fever; Delsym or Robutussin for cough. Mucinex or Mucinex D for congestion. Ricola or Halls for sore throat. Office visit if not better or if worse. 2)  Please schedule a follow-up appointment in 3 months. Prescriptions: AMOXICILLIN 500 MG CAPS (AMOXICILLIN) 2 caps by mouth bid  #40 x 0   Entered and Authorized by:   Tresa Garter MD   Signed by:   Tresa Garter MD on 05/26/2010   Method used:   Print then Give to Patient   RxID:   [id number] PROTONIX 40 MG TBEC (PANTOPRAZOLE SODIUM) 1 by mouth qam for indigestion  #90 x 3   Entered and Authorized by:   Tresa Garter MD  Signed by:   Tresa Garter MD on 05/26/2010   Method used:   Print then Give to Patient   RxID:   [id number] VITAMIN B-12 CR 1000 MCG  TBCR (CYANOCOBALAMIN) Take one tablet by mouth daily  #100 x 3   Entered and Authorized by:   Tresa Garter MD   Signed by:   Tresa Garter MD on 05/26/2010   Method used:   Print then Give to Patient   RxID:   [id number] VITAMIN D3 1000 UNIT  TABS (CHOLECALCIFEROL) 1 by mouth daily  #100 x 3   Entered and Authorized by:   Tresa Garter MD    Signed by:   Tresa Garter MD on 05/26/2010   Method used:   Print then Give to Patient   RxID:   [id number] FLUOXETINE HCL 20 MG  TABS (FLUOXETINE HCL) Take 1 tab by mouth daily  #90 Capsule x 3   Entered and Authorized by:   Tresa Garter MD   Signed by:   Tresa Garter MD on 05/26/2010   Method used:   Print then Give to Patient   RxID:   [id number] LORAZEPAM 1 MG TABS (LORAZEPAM) 1/2 or 1 by mouth two times a day as needed anxiety  #60 x 2   Entered and Authorized by:   Tresa Garter MD   Signed by:   Tresa Garter MD on 05/26/2010   Method used:   Print then Give to Patient   RxID:   [id number]    Orders Added: 1)  TLB-B12, Serum-Total ONLY [70350-K93] 2)  TLB-BMP (Basic Metabolic Panel-BMET) [80048-METABOL] 3)  TLB-CBC Platelet - w/Differential [85025-CBCD] 4)  TLB-Hepatic/Liver Function Pnl [80076-HEPATIC] 5)  TLB-Sedimentation Rate (ESR) [85652-ESR] 6)  TLB-TSH (Thyroid Stimulating Hormone) [84443-TSH] 7)  T-Vitamin D (25-Hydroxy) [81829-93716] 8)  TLB-Udip ONLY [81003-UDIP] 9)  Est. Patient Level IV [96789]

## 2010-06-16 LAB — URINE CULTURE: Colony Count: 40000

## 2010-06-16 LAB — URINALYSIS, ROUTINE W REFLEX MICROSCOPIC
Glucose, UA: NEGATIVE mg/dL
Ketones, ur: 15 mg/dL — AB
Protein, ur: NEGATIVE mg/dL

## 2010-06-16 LAB — COMPREHENSIVE METABOLIC PANEL
ALT: 18 U/L (ref 0–35)
Alkaline Phosphatase: 66 U/L (ref 39–117)
CO2: 22 mEq/L (ref 19–32)
Chloride: 105 mEq/L (ref 96–112)
GFR calc non Af Amer: >60 mL/min (ref >60)
Glucose, Bld: 97 mg/dL (ref 70–99)
Potassium: 3.2 mEq/L — ABNORMAL LOW (ref 3.5–5.1)
Sodium: 134 mEq/L — ABNORMAL LOW (ref 135–145)
Total Bilirubin: 0.8 mg/dL (ref 0.3–1.2)

## 2010-06-16 LAB — DIFFERENTIAL
Basophils Absolute: 0.1 10*3/uL (ref 0.0–0.1)
Eosinophils Relative: 1 % (ref 0–5)
Lymphs Abs: 4.4 10*3/uL — ABNORMAL HIGH (ref 0.7–4.0)
Monocytes Absolute: 0.9 10*3/uL (ref 0.1–1.0)
Neutrophils Relative %: 45 % (ref 43–77)

## 2010-06-16 LAB — CBC
Hemoglobin: 11.5 g/dL — ABNORMAL LOW (ref 12.0–15.0)
MCHC: 32.3 g/dL (ref 30.0–36.0)
RBC: 4.13 MIL/uL (ref 3.87–5.11)

## 2010-06-16 LAB — POCT CARDIAC MARKERS: Myoglobin, poc: 56.6 ng/mL (ref 12–200)

## 2010-07-25 NOTE — Op Note (Signed)
[facility]  Patient:    Margaret Preston, Margaret Preston Visit Number: [id number] MRN: [id number]          Service Type: SUR Location: 3W 0354 01 Attending Physician:  Charlton Haws Dictated by:   Currie Paris, M.D. Proc. Date: 02/23/01 Admit Date:  02/23/2001                             Operative Report  ACCOUNT NUMBER:  [id number]  PREOPERATIVE DIAGNOSIS:  Chronic appendicitis.  POSTOPERATIVE DIAGNOSIS:  Chronic appendicitis.  OPERATION:  Laparoscopic appendectomy.  SURGEON:  Currie Paris, M.D.  ASSISTANT:  Donnie Coffin. Samuella Cota, M.D.  ANESTHESIA:  General endotracheal.  CLINICAL HISTORY:  This patient is a 50 year old, who had had what appeared to be appendicitis of the phlegmon and hospitalized several weeks ago, treated with antibiotics and resolved.  Follow-up CT scan showed marked improvement. She is admitted electively to do interval appendectomy.  DESCRIPTION OF PROCEDURE:  The patient was seen in the holding area and of her questions answered, and she was prepared to proceed to surgery.  She was taken to the operating room where after satisfactory general endotracheal anesthesia had been obtained, a Foley catheter was placed, and the abdomen was prepped and draped.  She had a previous laparoscopic cholecystectomy, and I put some local into the umbilical area and made a longitudinal incision, trying to identify fascia.  The patient is obese, and there was a fair amount of surgery from her prior surgery, but eventually found the fascia and was able to get into the peritoneal cavity under direct vision.  The Hasson was introduced, and I began to insufflate the abdomen.  I placed the camera; however, we were having trouble getting good insufflation and good visualization because there was still some omentum stuck just to the right upper quadrant along by the umbilicus.  The preoperative Hasson was then removed and using some  blunt dissection with my finger, I was able to free that up and then the Hasson back in and reinsufflated the abdomen and then placed a 5 mm trocar in the right upper quadrant.  We then placed a 10-11 in the left lower quadrant, attempted to place it under direct vision but, because of her obesity, again we had some problems with visualization, and there was still some omentum stuck between the umbilicus and the left lower quadrant, and the trocar ended up going through some of these as it entered, but there was no bowel stuck up here.  The camera was placed into that port, and the grasper placed to grasp the appendix was going to be used for the 5 mm and the harmonic scalpel through the umbilical port.  The appendix was not immediately visualized because it appeared to be lying behind some adhesions where the small bowel was stuck to the right pelvic wall overlying the appendix.  These were taken down with a harmonic, and I was able to see the tip of the appendix and elevate that.  The appendix was also stuck on both sides around to the lateral peritoneum, and these were taken down with the harmonic and then the mesoappendix divided with the harmonic down to the base of the appendix.  Once I had good visualization and had this freed up, I was able to divide the appendix, although while we were working with the traction, the appendix split in two, and the first piece was taken out in a  bag and then the camera reinserted and the remaining dissection completed. The base of the appendix was then stapled and brought out through a bag.  We inspected the area and made sure everything was dry.  There were a fair amount of adhesions there, so this did appear consistent with prior appendicitis that had resolved.  What was visualized, the uterus and ovary area looked normal, but I could not really see the left ovary area.  We then irrigated and made sure everything was dry.  We checked back  and looked with a 5 mm camera in the right upper quadrant port to be sure that everything was okay, and I took a few more adhesions down with the harmonic to be sure we had that area cleared off well and got a good look to make sure there was no bleeding or these adhesions had been taken down, and also to be sure that there had been accidental injury of the intestine, and everything again appeared okay with just still some omental adhesions stuck up around the left lower quadrant trocar.  The left lower quadrant trocar was removed, and there was no bleeding from that site.  The 5 mm was removed and the umbilical port removed too.  I closed the umbilical fascia with several sutures of 0 Prolene because of her large size and the depth of this wound, I wanted to be sure we had good closure to try to reduce the chance of an umbilical hernia occurring.  The wounds were cleaned and skin was closed with 4-0 Monocryl subcuticular and Steri-Strips. The patient tolerated the procedure well.  There were no operative complications.  All counts were correct. Dictated by:   Currie Paris, M.D. Attending Physician:  Charlton Haws DD:  02/23/01 TD:  02/23/01 Job: [id number]

## 2010-07-25 NOTE — H&P (Signed)
[facility]  Patient:    Margaret Preston, Margaret Preston Visit Number: [id number] MRN: [id number]          Service Type: MED Location: 1E 0107 01 Attending Physician:  Sandi Raveling Dictated by:   Currie Paris, M.D. Admit Date:  01/19/2001   CC:         Sonda Primes, M.D. [facility]   History and Physical  CHIEF COMPLAINT:  Abdominal pain.  CLINICAL HISTORY:  Ms. Thier is a 50 year old lady who has had about a three-week history of intermittent right lower quadrant abdominal pain.  She attributes this to various problems, including indigestion.  She at one point thought she had a UTI and had a one-week course of Keflex and the pain got better, but this morning about 3 a.m., recurred and was primarily in the right upper quadrant.  It has not really been associated with any nausea or fever. She was seen in the emergency room because of this pain and has been here since approximately 2 p.m.  I was called to see her approximately 9:05 p.m. following the CT.  The patient continues to have some pain that localizes to the right lateral abdomen.  She considers it mild to moderate.  She has not had any diarrhea, and no other recent GI illnesses.  PAST MEDICAL HISTORY:  The patient has had a 100 weight loss, which she has done by dieting.  PRIOR OPERATIONS: 1. C-section. 2. Laparoscopic cholecystectomy.  REVIEW OF SYSTEMS:  HEENT basically negative.  CHEST:  No cough, shortness of breath.  HEART:  Negative.  ABDOMEN:  Negative except for HPI.  GENITOURINARY: Negative.  The patient does have a history of anxiety disorder and takes some Xanax and Paxil.  Those are her only two medications.  ALLERGIES:  She has no known allergies.  SOCIAL HISTORY:  She does not smoke or drink.  FAMILY HISTORY:  Unremarkable.  PHYSICAL EXAMINATION:  GENERAL:  The patient is somewhat overweight, but otherwise healthy-looking 50 year old.  She is alert and in no acute  distress.  HEENT:  Head is normocephalic, eyes nonicteric.  Pupils round and regular.  NECK:  Supple, no masses, no thyromegaly.  LUNGS:  Clear to auscultation.  HEART:  Regular, no murmurs, rubs, or gallops.  ABDOMEN:  Soft, but definitely tender in the right lateral abdomen and right lower quadrant.  There is no significant rebound present.  Bowel sounds are present.  PELVIC/RECTAL:  Not done.  EXTREMITIES:  Show no cyanosis or edema.  LABORATORY STUDIES:  White count 10,800 with hemoglobin 11.4.  Urine pregnancy test was negative and UA was essentially normal.  I reviewed her CT scan with Dr. Liliane Shi and she has _________ of phlegmon in the right lower quadrant around her appendix.  There is not a frank abscess present, but this looks more like more of a chronic process than something that has just been going on for 12 hours.  IMPRESSION:  Appendicitis, subacute to acute, with development of phlegmon.  PLAN:  I have gone over alternatives with the patient.  Recommendation at least initially would be for a trial of IV antibiotic therapy to see if this phlegmon could be resolved with antibiotics and then at some point six weeks or so from now, have an elective interval appendectomy.  I have told her we could go ahead now with surgery, which would be perhaps a more usual protocol, but given the amount of inflammatory process seen in the CT scan, there would be significant  likelihood that she would have to be done open and we would not be able to get this accomplished laparoscopically.  I think given that and the fact that she would be a difficulty to open appendectomy, that it would be appropriate to at least initiate a trial of IV antibiotic therapy and see what progress we have.  She does understand we might have to do her tomorrow or Friday, should she not begin to improve fairly dramatically. Dictated by:   Currie Paris, M.D. Attending Physician:  Sandi Raveling DD:  01/19/01 TD:  01/19/01 Job: [id number]

## 2010-07-25 NOTE — Discharge Summary (Signed)
[facility]  Patient:    Margaret Preston, Margaret Preston Visit Number: [id number] MRN: [id number]          Service Type: MED Location: 3W 0361 01 Attending Physician:  Charlton Haws Dictated by:   Currie Paris, M.D. Admit Date:  01/19/2001 Discharge Date: 01/22/2001   CC:         Sonda Primes, M.D. [facility]   Discharge Summary  VISIT #[id number]  FINAL DIAGNOSIS:  Chronic and subacute appendicitis.  CLINICAL HISTORY:  Margaret Preston is a 50 year old woman who presented with a three-week history of intermittent right lower quadrant abdominal pain and had initially been thought to have a UTI and treated with a course of Keflex, gotten better than worse. On CT scan from the emergency room, she was found to have what appeared to be an inflammatory phlegmon-type mass in the right lower quadrant consistent with more of a chronic appendicitis. There was a not a frank abscess present, but this was clear something had been going on for a week or two.  After discussion of the situation with the patient, we elected to treat her with IV antibiotics which were begun in the emergency room and the patient was admitted. She was initially started on Cipro and Flagyl. She remained afebrile and had a gradually decreasing pain over the next couple of days. She was maintained on IV antibiotics for three days at which point she was afebrile, had markedly less right lower quadrant tenderness, and no GI symptoms.  DISPOSITION:   It was felt safe to discharge her and she was sent home on Cipro and Flagyl for followup  in the my office with plans for interval appendectomy in approximately six weeks.  DISCHARGE MEDICATIONS:  She was discharged on Cipro, Flagyl, and Vicodin for pain.  LABORATORY STUDIES:  Laboratory studies included an initial hemoglobin of 11.4 with a white count of 10,800. Three subsequent white counts were all within the normal range. Electrolytes were  basically normal although initial serum sodium slightly low at 134. This was not thought clinically significant. Urinalysis was negative.  EKG showed normal sinus rhythm. Dictated by:   Currie Paris, M.D. Attending Physician:  Charlton Haws DD:  02/07/01 TD:  02/07/01 Job: [phone] [id number]

## 2010-10-03 ENCOUNTER — Telehealth: Payer: Self-pay | Admitting: *Deleted

## 2010-10-03 NOTE — Telephone Encounter (Signed)
Rec rf req for Lorazepam 1 mg 1/2 po bid prn anxiety. # 60. Last filled 07-30-10. Ok to Rf?

## 2010-10-03 NOTE — Telephone Encounter (Signed)
OK to fill this prescription with additional refills x3 Thank you!  

## 2010-10-06 ENCOUNTER — Telehealth: Payer: Self-pay | Admitting: *Deleted

## 2010-10-06 MED ORDER — LORAZEPAM 1 MG PO TABS: 0.5000 mg | ORAL_TABLET | Freq: Two times a day (BID) | ORAL | Status: AC | PRN

## 2010-10-06 MED ORDER — LORAZEPAM 1 MG PO TABS: 1.0000 mg | ORAL_TABLET | Freq: Two times a day (BID) | ORAL | Status: DC | PRN

## 2010-10-06 NOTE — Telephone Encounter (Signed)
Rx called in per MD 

## 2010-10-06 NOTE — Telephone Encounter (Signed)
Pharm called - pt has been getting 1/2 to 1 bid # 60, med list and what was just called in today did not match what pt has been getting.

## 2010-11-18 ENCOUNTER — Encounter: Payer: Self-pay | Admitting: Internal Medicine

## 2010-11-18 ENCOUNTER — Ambulatory Visit (INDEPENDENT_AMBULATORY_CARE_PROVIDER_SITE_OTHER): Payer: BC Managed Care – PPO | Admitting: Internal Medicine

## 2010-11-18 VITALS — BP 128/84 | HR 98 | Temp 99.4°F | Wt 279.0 lb

## 2010-11-18 DIAGNOSIS — F329 Major depressive disorder, single episode, unspecified: Secondary | ICD-10-CM

## 2010-11-18 DIAGNOSIS — E559 Vitamin D deficiency, unspecified: Secondary | ICD-10-CM

## 2010-11-18 DIAGNOSIS — E538 Deficiency of other specified B group vitamins: Secondary | ICD-10-CM

## 2010-11-18 DIAGNOSIS — L509 Urticaria, unspecified: Secondary | ICD-10-CM

## 2010-11-18 MED ORDER — PREDNISONE 10 MG PO TABS: ORAL_TABLET | ORAL | Status: DC

## 2010-11-18 MED ORDER — HYDROXYZINE HCL 10 MG PO TABS: 10.0000 mg | ORAL_TABLET | ORAL | Status: DC | PRN

## 2010-11-18 MED ORDER — TRIAMCINOLONE ACETONIDE 0.5 % EX CREA: TOPICAL_CREAM | Freq: Three times a day (TID) | CUTANEOUS | Status: AC

## 2010-11-18 MED ORDER — METHYLPREDNISOLONE ACETATE 80 MG/ML IJ SUSP
120.0000 mg | Freq: Once | INTRAMUSCULAR | Status: AC
  Administered 2010-11-18: 120 mg via INTRAMUSCULAR

## 2010-11-20 ENCOUNTER — Encounter: Payer: Self-pay | Admitting: Internal Medicine

## 2010-11-20 DIAGNOSIS — L509 Urticaria, unspecified: Secondary | ICD-10-CM | POA: Insufficient documentation

## 2010-11-20 NOTE — Progress Notes (Signed)
  Subjective:    Patient ID: Margaret Preston, female    DOB: [birth year], 50 y.o.   MRN: [id number]  HPI  C/o severe ithing and multiple rash elements that started 2 d ago upon awakening. Not better w/Benadryl. She is very upset and tearful. C/o stress  The patient is here to follow up on chronic depression, anxiety not controlled with medicines at present  Review of Systems  Constitutional: Positive for fatigue. Negative for chills, activity change, appetite change and unexpected weight change.  HENT: Negative for congestion, mouth sores and sinus pressure.   Eyes: Negative for visual disturbance.  Respiratory: Negative for cough and chest tightness.   Gastrointestinal: Negative for nausea and abdominal pain.  Genitourinary: Negative for frequency, difficulty urinating and vaginal pain.  Musculoskeletal: Negative for back pain and gait problem.  Skin: Positive for rash. Negative for pallor.  Neurological: Negative for dizziness, tremors, weakness, numbness and headaches.  Psychiatric/Behavioral: Positive for sleep disturbance and dysphoric mood. Negative for suicidal ideas and confusion.       Objective:   Physical Exam  Constitutional: She appears well-developed. No distress.       Obese  HENT:  Head: Normocephalic.  Right Ear: External ear normal.  Left Ear: External ear normal.  Nose: Nose normal.  Mouth/Throat: Oropharynx is clear and moist.  Eyes: Conjunctivae are normal. Pupils are equal, round, and reactive to light. Right eye exhibits no discharge. Left eye exhibits no discharge.  Neck: Normal range of motion. Neck supple. No JVD present. No tracheal deviation present. No thyromegaly present.  Cardiovascular: Normal rate, regular rhythm and normal heart sounds.   Pulmonary/Chest: No stridor. No respiratory distress. She has no wheezes.  Abdominal: Soft. Bowel sounds are normal. She exhibits no distension and no mass. There is no tenderness. There is no rebound and no  guarding.  Musculoskeletal: She exhibits no edema and no tenderness.  Lymphadenopathy:    She has no cervical adenopathy.  Neurological: She displays normal reflexes. No cranial nerve deficit. She exhibits normal muscle tone. Coordination normal.  Skin: Rash noted. There is erythema.       Multiple 1-2 cm hives in clusters all over mostly on 4 extr and trunk  Psychiatric: Her behavior is normal. Judgment and thought content normal.       tearfull          Assessment & Plan:

## 2010-11-20 NOTE — Assessment & Plan Note (Signed)
Continue with current prescription therapy as reflected on the Med list.  

## 2010-11-20 NOTE — Patient Instructions (Signed)
See Info provided

## 2010-11-20 NOTE — Assessment & Plan Note (Signed)
9/12 - suspect  Bed bugs bites  Potential benefits of a long term steroid  use as well as potential risks  and complications were explained to the patient and were aknowledged.  Prednisone 10 mg: take 4 tabs a day x 3 days; then 3 tabs a day x 4 days; then 2 tabs a day x 4 days, then 1 tab a day x 6 days, then stop. Take pc. Depomedr 120 mg IM

## 2010-11-20 NOTE — Assessment & Plan Note (Signed)
Risks associated with treatment noncompliance were discussed. Compliance was encouraged. 

## 2010-11-21 ENCOUNTER — Telehealth: Payer: Self-pay | Admitting: *Deleted

## 2010-11-21 MED ORDER — PREDNISONE 10 MG PO TABS: ORAL_TABLET | ORAL | Status: DC

## 2010-11-21 MED ORDER — HYDROXYZINE PAMOATE 50 MG PO CAPS: 50.0000 mg | ORAL_CAPSULE | Freq: Four times a day (QID) | ORAL | Status: AC | PRN

## 2010-11-21 NOTE — Telephone Encounter (Signed)
Message copied by Merrilyn Puma on Fri Nov 21, 2010  8:02 AM ------      Message from: Janeal Holmes      Created: Thu Nov 20, 2010 10:46 PM       Misty Stanley, please, call - is she better?      Thx

## 2010-11-21 NOTE — Telephone Encounter (Signed)
Pt still c/o she is very itchy. She has not gotten any relieve and states she can not rest. Please advise should she do anything different?

## 2010-11-21 NOTE — Telephone Encounter (Signed)
Left mess for patient to call back./ left detailed mess advising pt to check with pharmacy.

## 2010-11-21 NOTE — Telephone Encounter (Signed)
Sorry! Any new hives? Start new predn and hydroxyzine dose - see Meds Thx

## 2010-11-24 ENCOUNTER — Other Ambulatory Visit (INDEPENDENT_AMBULATORY_CARE_PROVIDER_SITE_OTHER): Payer: BC Managed Care – PPO

## 2010-11-24 ENCOUNTER — Encounter: Payer: Self-pay | Admitting: Internal Medicine

## 2010-11-24 ENCOUNTER — Ambulatory Visit (INDEPENDENT_AMBULATORY_CARE_PROVIDER_SITE_OTHER): Payer: BC Managed Care – PPO | Admitting: Internal Medicine

## 2010-11-24 ENCOUNTER — Ambulatory Visit (INDEPENDENT_AMBULATORY_CARE_PROVIDER_SITE_OTHER)
Admission: RE | Admit: 2010-11-24 | Discharge: 2010-11-24 | Disposition: A | Discharge status: Home / Self Care | Payer: BC Managed Care – PPO | Source: Ambulatory Visit | Attending: Internal Medicine | Admitting: Internal Medicine

## 2010-11-24 DIAGNOSIS — L509 Urticaria, unspecified: Secondary | ICD-10-CM

## 2010-11-24 DIAGNOSIS — F329 Major depressive disorder, single episode, unspecified: Secondary | ICD-10-CM

## 2010-11-24 DIAGNOSIS — F411 Generalized anxiety disorder: Secondary | ICD-10-CM

## 2010-11-24 LAB — COMPREHENSIVE METABOLIC PANEL
AST: 30 U/L (ref 0–37)
Albumin: 3.5 g/dL (ref 3.5–5.2)
BUN: 19 mg/dL (ref 6–23)
CO2: 31 mEq/L (ref 19–32)
Calcium: 9.1 mg/dL (ref 8.4–10.5)
Chloride: 100 mEq/L (ref 96–112)
Creatinine, Ser: 1 mg/dL (ref 0.4–1.2)
GFR: 76.81 mL/min (ref >60.00)
Potassium: 3.5 mEq/L (ref 3.5–5.1)

## 2010-11-24 LAB — CBC WITH DIFFERENTIAL/PLATELET
Basophils Relative: 3.2 % — ABNORMAL HIGH (ref 0.0–3.0)
Eosinophils Absolute: 0.8 10*3/uL — ABNORMAL HIGH (ref 0.0–0.7)
Eosinophils Relative: 5 % (ref 0.0–5.0)
Hemoglobin: 10.6 g/dL — ABNORMAL LOW (ref 12.0–15.0)
Lymphocytes Relative: 29 % (ref 12.0–46.0)
MCHC: 31.1 g/dL (ref 30.0–36.0)
Monocytes Relative: 17.2 % — ABNORMAL HIGH (ref 3.0–12.0)
Neutro Abs: 7 10*3/uL (ref 1.4–7.7)
Neutrophils Relative %: 45.6 % (ref 43.0–77.0)
RBC: 4.58 Mil/uL (ref 3.87–5.11)
WBC: 15.4 10*3/uL — ABNORMAL HIGH (ref 4.5–10.5)

## 2010-11-24 LAB — SEDIMENTATION RATE: Sed Rate: 19 mm/hr (ref 0–22)

## 2010-11-24 LAB — URINALYSIS
Hgb urine dipstick: NEGATIVE
Nitrite: NEGATIVE
Specific Gravity, Urine: 1.025 (ref 1.000–1.030)
Total Protein, Urine: NEGATIVE
Urine Glucose: NEGATIVE
pH: 6 (ref 5.0–8.0)

## 2010-11-24 MED ORDER — PREDNISONE 10 MG PO TABS: ORAL_TABLET | ORAL | Status: AC

## 2010-11-24 NOTE — Progress Notes (Signed)
  Subjective:    Patient ID: Margaret Preston, female    DOB: 03-22-1960, 50 y.o.   MRN: [id number]  HPI  F/u hives. Very little better if any. C/o anxiety; depressed. No new lesions in 1 d HPI  C/o severe ithing and multiple rash elements that started 2 d ago upon awakening. Not better w/Benadryl. She is very upset and tearful.  C/o stress  The patient is here to follow up on chronic depression, anxiety not controlled with medicines at present  Review of Systems  Constitutional: Positive for fatigue. Negative for chills, activity change, appetite change and unexpected weight change.  HENT: Negative for congestion, mouth sores and sinus pressure.  Eyes: Negative for visual disturbance.  Respiratory: Negative for cough and chest tightness.  Gastrointestinal: Negative for nausea and abdominal pain.  Genitourinary: Negative for frequency, difficulty urinating and vaginal pain.  Musculoskeletal: Negative for back pain and gait problem.  Skin: Positive for rash. Negative for pallor.  Neurological: Negative for dizziness, tremors, weakness, numbness and headaches.  Psychiatric/Behavioral: Positive for sleep disturbance and dysphoric mood. Negative for suicidal ideas and confusion.    Objective:   Physical Exam  Constitutional: She appears well-developed. No distress.  Obese  HENT:  Head: Normocephalic.  Right Ear: External ear normal.  Left Ear: External ear normal.  Nose: Nose normal.  Mouth/Throat: Oropharynx is clear and moist.  Eyes: Conjunctivae are normal. Pupils are equal, round, and reactive to light. Right eye exhibits no discharge. Left eye exhibits no discharge.  Neck: Normal range of motion. Neck supple. No JVD present. No tracheal deviation present. No thyromegaly present.  Cardiovascular: Normal rate, regular rhythm and normal heart sounds.  Pulmonary/Chest: No stridor. No respiratory distress. She has no wheezes.  Abdominal: Soft. Bowel sounds are normal. She exhibits  no distension and no mass. There is no tenderness. There is no rebound and no guarding.  Musculoskeletal: She exhibits no edema and no tenderness.  Lymphadenopathy:  She has no cervical adenopathy.  Neurological: She displays normal reflexes. No cranial nerve deficit. She exhibits normal muscle tone. Coordination normal.  Skin: Rash noted. There is erythema.  Multiple 1-2 cm hives in clusters all over mostly on 4 extr and trunk  - poss 20-30% better Psychiatric: Her behavior is normal. Judgment and thought content normal.  tearfull    Review of Systems     Objective:   Physical Exam   Lab Results  Component Value Date   WBC 15.4* 11/24/2010   HGB 10.6* 11/24/2010   HCT 34.2* 11/24/2010   PLT 715.0* 11/24/2010   ALT 41* 11/24/2010   AST 30 11/24/2010   NA 137 11/24/2010   K 3.5 11/24/2010   CL 100 11/24/2010   CREATININE 1.0 11/24/2010   BUN 19 11/24/2010   CO2 31 11/24/2010   TSH 5.25 11/24/2010   CXR was OK      Assessment & Plan:   Elev WBC due to steroids

## 2010-11-25 ENCOUNTER — Telehealth: Payer: Self-pay | Admitting: *Deleted

## 2010-11-25 LAB — HEPATITIS B SURFACE ANTIGEN: Hepatitis B Surface Ag: NEGATIVE

## 2010-11-25 LAB — TSH: TSH: 5.25 u[IU]/mL (ref 0.35–5.50)

## 2010-11-25 NOTE — Telephone Encounter (Signed)
Pt had OV on 11-24-10 and was given meds.

## 2010-11-25 NOTE — Telephone Encounter (Signed)
Pt left vm requesting results from 11-24-10 labs and xray. Please advise.

## 2010-11-25 NOTE — Telephone Encounter (Signed)
Labs ok except for mild anemia CXR was OK Thx

## 2010-11-26 ENCOUNTER — Encounter: Payer: Self-pay | Admitting: Internal Medicine

## 2010-11-26 NOTE — Assessment & Plan Note (Signed)
Worse situationally 

## 2010-11-26 NOTE — Assessment & Plan Note (Signed)
Worse situationally

## 2010-11-26 NOTE — Telephone Encounter (Signed)
Left detailed VM for patient.

## 2010-11-26 NOTE — Assessment & Plan Note (Addendum)
Improved a little. Will bx if not resolved soon. Will get labs

## 2011-01-14 ENCOUNTER — Telehealth: Payer: Self-pay | Admitting: *Deleted

## 2011-01-14 MED ORDER — LORAZEPAM 1 MG PO TABS: 0.5000 mg | ORAL_TABLET | Freq: Two times a day (BID) | ORAL | Status: DC

## 2011-01-14 NOTE — Telephone Encounter (Signed)
OK to fill this prescription with additional refills x3 Thank you!  

## 2011-01-14 NOTE — Telephone Encounter (Signed)
Rf req for Ativan 1 mg 0.5-1mg  bid prn anxiety. Ok to Rf?

## 2011-10-05 ENCOUNTER — Ambulatory Visit (INDEPENDENT_AMBULATORY_CARE_PROVIDER_SITE_OTHER): Payer: BC Managed Care – PPO | Admitting: Internal Medicine

## 2011-10-05 ENCOUNTER — Encounter: Payer: Self-pay | Admitting: Internal Medicine

## 2011-10-05 VITALS — BP 110/68 | HR 80 | Temp 98.5°F | Resp 16 | Wt 276.0 lb

## 2011-10-05 DIAGNOSIS — E538 Deficiency of other specified B group vitamins: Secondary | ICD-10-CM

## 2011-10-05 DIAGNOSIS — E559 Vitamin D deficiency, unspecified: Secondary | ICD-10-CM

## 2011-10-05 DIAGNOSIS — E039 Hypothyroidism, unspecified: Secondary | ICD-10-CM

## 2011-10-05 DIAGNOSIS — F329 Major depressive disorder, single episode, unspecified: Secondary | ICD-10-CM

## 2011-10-05 MED ORDER — TRETINOIN 0.025 % EX CREA: TOPICAL_CREAM | Freq: Every day | CUTANEOUS | Status: DC

## 2011-10-05 MED ORDER — FLUOXETINE HCL 20 MG PO TABS: 20.0000 mg | ORAL_TABLET | Freq: Every day | ORAL | Status: DC

## 2011-10-05 MED ORDER — PANTOPRAZOLE SODIUM 40 MG PO TBEC: 40.0000 mg | DELAYED_RELEASE_TABLET | Freq: Every day | ORAL | Status: DC

## 2011-10-05 MED ORDER — LORAZEPAM 1 MG PO TABS: 0.5000 mg | ORAL_TABLET | Freq: Two times a day (BID) | ORAL | Status: DC

## 2011-10-05 NOTE — Assessment & Plan Note (Signed)
Continue with current prescription therapy as reflected on the Med list.  

## 2011-10-05 NOTE — Progress Notes (Signed)
  Subjective:    Patient ID: Margaret Preston, female    DOB: [birth year], 51 y.o.   MRN: [id number]  HPI  F/u on hives - no more; on anxiety; depressed.   HPI  C/o severe ithing and multiple rash elements that started 2 d ago upon awakening. Not better w/Benadryl. She is very upset and tearful.  C/o stress  The patient is here to follow up on chronic depression, anxiety not controlled with medicines at present  Review of Systems  Constitutional: Positive for fatigue. Negative for chills, activity change, appetite change and unexpected weight change.  HENT: Negative for congestion, mouth sores and sinus pressure.  Eyes: Negative for visual disturbance.  Respiratory: Negative for cough and chest tightness.  Gastrointestinal: Negative for nausea and abdominal pain.  Genitourinary: Negative for frequency, difficulty urinating and vaginal pain.  Musculoskeletal: Negative for back pain and gait problem.  Skin: No rash. Negative for pallor.  Neurological: Negative for dizziness, tremors, weakness, numbness and headaches.  Psychiatric/Behavioral: Positive for sleep disturbance and dysphoric mood - better. Negative for suicidal ideas and confusion.    Objective:   Physical Exam  Constitutional: She appears well-developed. No distress.  Obese  HENT:  Head: Normocephalic.  Right Ear: External ear normal.  Left Ear: External ear normal.  Nose: Nose normal.  Mouth/Throat: Oropharynx is clear and moist.  Eyes: Conjunctivae are normal. Pupils are equal, round, and reactive to light. Right eye exhibits no discharge. Left eye exhibits no discharge.  Neck: Normal range of motion. Neck supple. No JVD present. No tracheal deviation present. No thyromegaly present.  Cardiovascular: Normal rate, regular rhythm and normal heart sounds.  Pulmonary/Chest: No stridor. No respiratory distress. She has no wheezes.  Abdominal: Soft. Bowel sounds are normal. She exhibits no distension and no mass. There is  no tenderness. There is no rebound and no guarding.  Musculoskeletal: She exhibits no edema and no tenderness.  Lymphadenopathy:  She has no cervical adenopathy.  Neurological: She displays normal reflexes. No cranial nerve deficit. She exhibits normal muscle tone. Coordination normal.  Skin: No rash noted.  Psychiatric: Her behavior is normal. Judgment and thought content normal.  Not tearfull    Review of Systems     Objective:   Physical Exam   Lab Results  Component Value Date   WBC 15.4* 11/24/2010   HGB 10.6* 11/24/2010   HCT 34.2* 11/24/2010   PLT 715.0* 11/24/2010   ALT 41* 11/24/2010   AST 30 11/24/2010   NA 137 11/24/2010   K 3.5 11/24/2010   CL 100 11/24/2010   CREATININE 1.0 11/24/2010   BUN 19 11/24/2010   CO2 31 11/24/2010   TSH 5.25 11/24/2010         Assessment & Plan:

## 2012-05-18 ENCOUNTER — Encounter: Payer: Self-pay | Admitting: Internal Medicine

## 2012-05-18 ENCOUNTER — Ambulatory Visit (INDEPENDENT_AMBULATORY_CARE_PROVIDER_SITE_OTHER): Payer: BC Managed Care – PPO | Admitting: Internal Medicine

## 2012-05-18 ENCOUNTER — Other Ambulatory Visit (INDEPENDENT_AMBULATORY_CARE_PROVIDER_SITE_OTHER): Payer: BC Managed Care – PPO

## 2012-05-18 VITALS — BP 120/70 | HR 76 | Temp 97.6°F | Resp 16 | Wt 285.0 lb

## 2012-05-18 DIAGNOSIS — L509 Urticaria, unspecified: Secondary | ICD-10-CM

## 2012-05-18 DIAGNOSIS — E538 Deficiency of other specified B group vitamins: Secondary | ICD-10-CM

## 2012-05-18 DIAGNOSIS — F411 Generalized anxiety disorder: Secondary | ICD-10-CM

## 2012-05-18 DIAGNOSIS — D649 Anemia, unspecified: Secondary | ICD-10-CM

## 2012-05-18 DIAGNOSIS — N920 Excessive and frequent menstruation with regular cycle: Secondary | ICD-10-CM

## 2012-05-18 DIAGNOSIS — E559 Vitamin D deficiency, unspecified: Secondary | ICD-10-CM

## 2012-05-18 DIAGNOSIS — E039 Hypothyroidism, unspecified: Secondary | ICD-10-CM

## 2012-05-18 DIAGNOSIS — E785 Hyperlipidemia, unspecified: Secondary | ICD-10-CM

## 2012-05-18 LAB — CBC WITH DIFFERENTIAL/PLATELET
Basophils Relative: 0.1 % (ref 0.0–3.0)
Eosinophils Relative: 3.3 % (ref 0.0–5.0)
Lymphocytes Relative: 41.9 % (ref 12.0–46.0)
Monocytes Relative: 11.3 % (ref 3.0–12.0)
Neutrophils Relative %: 43.4 % (ref 43.0–77.0)
RBC: 4.52 Mil/uL (ref 3.87–5.11)
WBC: 6.6 10*3/uL (ref 4.5–10.5)

## 2012-05-18 LAB — URINALYSIS
Ketones, ur: NEGATIVE
Leukocytes, UA: NEGATIVE
Specific Gravity, Urine: 1.015 (ref 1.000–1.030)
Urobilinogen, UA: 0.2 (ref 0.0–1.0)
pH: 6.5 (ref 5.0–8.0)

## 2012-05-18 LAB — BASIC METABOLIC PANEL
BUN: 10 mg/dL (ref 6–23)
Chloride: 103 mEq/L (ref 96–112)
Glucose, Bld: 103 mg/dL — ABNORMAL HIGH (ref 70–99)
Potassium: 4 mEq/L (ref 3.5–5.1)

## 2012-05-18 LAB — TSH: TSH: 1.56 u[IU]/mL (ref 0.35–5.50)

## 2012-05-18 LAB — VITAMIN B12: Vitamin B-12: >1500 pg/mL — ABNORMAL HIGH (ref 211–911)

## 2012-05-18 LAB — VITAMIN D 25 HYDROXY (VIT D DEFICIENCY, FRACTURES): Vit D, 25-Hydroxy: 36 ng/mL (ref 30–89)

## 2012-05-18 LAB — HEPATIC FUNCTION PANEL
ALT: 20 U/L (ref 0–35)
Albumin: 3.5 g/dL (ref 3.5–5.2)
Total Bilirubin: 0.3 mg/dL (ref 0.3–1.2)
Total Protein: 8.1 g/dL (ref 6.0–8.3)

## 2012-05-18 MED ORDER — VITAMIN D3 25 MCG (1000 UNIT) PO TABS: 1000.0000 [IU] | ORAL_TABLET | Freq: Every day | ORAL | Status: DC

## 2012-05-18 MED ORDER — CYANOCOBALAMIN 1000 MCG PO TABS: 100.0000 ug | ORAL_TABLET | Freq: Every day | ORAL | Status: DC

## 2012-05-18 MED ORDER — IBUPROFEN 800 MG PO TABS: 800.0000 mg | ORAL_TABLET | Freq: Three times a day (TID) | ORAL | Status: DC | PRN

## 2012-05-18 MED ORDER — TRETINOIN 0.025 % EX CREA: TOPICAL_CREAM | Freq: Every day | CUTANEOUS | Status: DC

## 2012-05-18 MED ORDER — PANTOPRAZOLE SODIUM 40 MG PO TBEC: 40.0000 mg | DELAYED_RELEASE_TABLET | Freq: Every day | ORAL | Status: DC

## 2012-05-18 MED ORDER — FLUOXETINE HCL 20 MG PO TABS: 20.0000 mg | ORAL_TABLET | Freq: Every day | ORAL | Status: DC

## 2012-05-18 MED ORDER — LORAZEPAM 1 MG PO TABS: 0.5000 mg | ORAL_TABLET | Freq: Two times a day (BID) | ORAL | Status: DC | PRN

## 2012-05-18 NOTE — Assessment & Plan Note (Signed)
Better now - sh saw her GYN 3 wks ago

## 2012-05-18 NOTE — Assessment & Plan Note (Signed)
Resolved

## 2012-05-18 NOTE — Assessment & Plan Note (Signed)
Refractory 3/14 lap band discussed

## 2012-05-18 NOTE — Assessment & Plan Note (Signed)
Recheck TSH 

## 2012-05-18 NOTE — Progress Notes (Signed)
  Subjective:     HPI  F/u on hives - no more; on anxiety; depression, obesity. She has been off Prozac x months - worse.  Wt Readings from Last 3 Encounters:  05/18/12 285 lb (129.275 kg)  10/05/11 276 lb (125.193 kg)  11/18/10 279 lb (126.554 kg)   BP Readings from Last 3 Encounters:  05/18/12 120/70  10/05/11 110/68  11/24/10 112/72      HPI   She is upset and tearful.  C/o stress  The patient is here to follow up on chronic depression, anxiety - worse Review of Systems  Constitutional: Positive for fatigue. Negative for chills, activity change, appetite change and unexpected weight change.  HENT: Negative for congestion, mouth sores and sinus pressure.  Eyes: Negative for visual disturbance.  Respiratory: Negative for cough and chest tightness.  Gastrointestinal: Negative for nausea and abdominal pain.  Genitourinary: Negative for frequency, difficulty urinating and vaginal pain.  Musculoskeletal: Negative for back pain and gait problem.  Skin: No rash. Negative for pallor.  Neurological: Negative for dizziness, tremors, weakness, numbness and headaches.  Psychiatric/Behavioral: Positive for sleep disturbance and dysphoric mood - better. Negative for suicidal ideas and confusion.    Objective:   Physical Exam  Constitutional: She appears well-developed. No distress.  Obese  HENT:  Head: Normocephalic.  Right Ear: External ear normal.  Left Ear: External ear normal.  Nose: Nose normal.  Mouth/Throat: Oropharynx is clear and moist.  Eyes: Conjunctivae are normal. Pupils are equal, round, and reactive to light. Right eye exhibits no discharge. Left eye exhibits no discharge.  Neck: Normal range of motion. Neck supple. No JVD present. No tracheal deviation present. No thyromegaly present.  Cardiovascular: Normal rate, regular rhythm and normal heart sounds.  Pulmonary/Chest: No stridor. No respiratory distress. She has no wheezes.  Abdominal: Soft. Bowel sounds are  normal. She exhibits no distension and no mass. There is no tenderness. There is no rebound and no guarding.  Musculoskeletal: She exhibits no edema and no tenderness.  Lymphadenopathy:  She has no cervical adenopathy.  Neurological: She displays normal reflexes. No cranial nerve deficit. She exhibits normal muscle tone. Coordination normal.  Skin: No rash noted.  Psychiatric: Her behavior is normal. Judgment and thought content normal.  Depressed, tearfull  Not suicidal   Review of Systems     Objective:   Physical Exam   Lab Results  Component Value Date   WBC 15.4* 11/24/2010   HGB 10.6* 11/24/2010   HCT 34.2* 11/24/2010   PLT 715.0* 11/24/2010   ALT 41* 11/24/2010   AST 30 11/24/2010   NA 137 11/24/2010   K 3.5 11/24/2010   CL 100 11/24/2010   CREATININE 1.0 11/24/2010   BUN 19 11/24/2010   CO2 31 11/24/2010   TSH 5.25 11/24/2010         Assessment & Plan:

## 2012-05-18 NOTE — Assessment & Plan Note (Signed)
Continue with current  therapy as reflected on the Med list. Labs  

## 2012-05-18 NOTE — Assessment & Plan Note (Signed)
Re-start B12 Labs 

## 2012-05-18 NOTE — Assessment & Plan Note (Signed)
Continue with current prescription therapy as reflected on the Med list.  

## 2013-02-28 ENCOUNTER — Encounter: Payer: Self-pay | Admitting: Internal Medicine

## 2013-02-28 ENCOUNTER — Other Ambulatory Visit (INDEPENDENT_AMBULATORY_CARE_PROVIDER_SITE_OTHER): Payer: BC Managed Care – PPO

## 2013-02-28 ENCOUNTER — Ambulatory Visit (INDEPENDENT_AMBULATORY_CARE_PROVIDER_SITE_OTHER): Payer: BC Managed Care – PPO | Admitting: Internal Medicine

## 2013-02-28 VITALS — BP 140/80 | HR 80 | Resp 16 | Ht 66.5 in | Wt 280.0 lb

## 2013-02-28 DIAGNOSIS — K219 Gastro-esophageal reflux disease without esophagitis: Secondary | ICD-10-CM

## 2013-02-28 DIAGNOSIS — E559 Vitamin D deficiency, unspecified: Secondary | ICD-10-CM

## 2013-02-28 DIAGNOSIS — E039 Hypothyroidism, unspecified: Secondary | ICD-10-CM

## 2013-02-28 DIAGNOSIS — Z0000 Encounter for general adult medical examination without abnormal findings: Secondary | ICD-10-CM

## 2013-02-28 DIAGNOSIS — F411 Generalized anxiety disorder: Secondary | ICD-10-CM

## 2013-02-28 DIAGNOSIS — R7309 Other abnormal glucose: Secondary | ICD-10-CM

## 2013-02-28 DIAGNOSIS — R739 Hyperglycemia, unspecified: Secondary | ICD-10-CM

## 2013-02-28 DIAGNOSIS — E538 Deficiency of other specified B group vitamins: Secondary | ICD-10-CM

## 2013-02-28 LAB — CBC WITH DIFFERENTIAL/PLATELET
Basophils Absolute: 0.4 10*3/uL — ABNORMAL HIGH (ref 0.0–0.1)
Basophils Relative: 4.6 % — ABNORMAL HIGH (ref 0.0–3.0)
Eosinophils Absolute: 0.6 10*3/uL (ref 0.0–0.7)
HCT: 35.1 % — ABNORMAL LOW (ref 36.0–46.0)
Lymphocytes Relative: 36.4 % (ref 12.0–46.0)
MCHC: 33.5 g/dL (ref 30.0–36.0)
MCV: 85.4 fl (ref 78.0–100.0)
Monocytes Absolute: 0.8 10*3/uL (ref 0.1–1.0)
Neutrophils Relative %: 40.7 % — ABNORMAL LOW (ref 43.0–77.0)
RBC: 4.11 Mil/uL (ref 3.87–5.11)

## 2013-02-28 LAB — URINALYSIS
Bilirubin Urine: NEGATIVE
Leukocytes, UA: NEGATIVE
Nitrite: NEGATIVE
Specific Gravity, Urine: 1.01 (ref 1.000–1.030)
Urine Glucose: NEGATIVE
pH: 6 (ref 5.0–8.0)

## 2013-02-28 LAB — BASIC METABOLIC PANEL
Calcium: 9 mg/dL (ref 8.4–10.5)
Chloride: 105 mEq/L (ref 96–112)
GFR: 136.75 mL/min (ref >60.00)
Potassium: 3.5 mEq/L (ref 3.5–5.1)
Sodium: 138 mEq/L (ref 135–145)

## 2013-02-28 LAB — HEPATIC FUNCTION PANEL: Albumin: 3.5 g/dL (ref 3.5–5.2)

## 2013-02-28 LAB — TSH: TSH: 1.22 u[IU]/mL (ref 0.35–5.50)

## 2013-02-28 MED ORDER — TRETINOIN 0.025 % EX CREA: TOPICAL_CREAM | Freq: Every day | CUTANEOUS | Status: DC

## 2013-02-28 MED ORDER — LORAZEPAM 2 MG PO TABS: 1.0000 mg | ORAL_TABLET | Freq: Two times a day (BID) | ORAL | Status: DC | PRN

## 2013-02-28 MED ORDER — PANTOPRAZOLE SODIUM 40 MG PO TBEC: 40.0000 mg | DELAYED_RELEASE_TABLET | Freq: Every day | ORAL | Status: DC

## 2013-02-28 MED ORDER — TRAZODONE HCL 50 MG PO TABS: 50.0000 mg | ORAL_TABLET | Freq: Every day | ORAL | Status: DC

## 2013-02-28 MED ORDER — ESCITALOPRAM OXALATE 20 MG PO TABS: 20.0000 mg | ORAL_TABLET | Freq: Every day | ORAL | Status: DC

## 2013-02-28 NOTE — Assessment & Plan Note (Signed)
Continue with current prescription therapy as reflected on the Med list.  

## 2013-02-28 NOTE — Assessment & Plan Note (Addendum)
Continue with current prescription therapy as reflected on the Med list. Labs  

## 2013-02-28 NOTE — Progress Notes (Signed)
Pre visit review using our clinic review tool, if applicable. No additional management support is needed unless otherwise documented below in the visit note. 

## 2013-02-28 NOTE — Assessment & Plan Note (Addendum)
Continue with current prescription therapy as reflected on the Med list.  

## 2013-02-28 NOTE — Assessment & Plan Note (Signed)
Refractory 3/14 lap band discussed  Wt Readings from Last 3 Encounters:  02/28/13 280 lb (127.007 kg)  05/18/12 285 lb (129.275 kg)  10/05/11 276 lb (125.193 kg)

## 2013-02-28 NOTE — Progress Notes (Signed)
  Subjective:     HPI The patient is here for a wellness exam. The patient has been doing well overall without major physical or psychological issues going on lately. F/u on hives - no more; on anxiety; depression, obesity. She has been off Prozac x 1 mo C/o insomnia  Wt Readings from Last 3 Encounters:  02/28/13 280 lb (127.007 kg)  05/18/12 285 lb (129.275 kg)  10/05/11 276 lb (125.193 kg)   BP Readings from Last 3 Encounters:  02/28/13 140/80  05/18/12 120/70  10/05/11 110/68      HPI   C/o stress  The patient is here to follow up on chronic depression, anxiety - worse Review of Systems  Constitutional: Positive for fatigue. Negative for chills, activity change, appetite change and unexpected weight change.  HENT: Negative for congestion, mouth sores and sinus pressure.  Eyes: Negative for visual disturbance.  Respiratory: Negative for cough and chest tightness.  Gastrointestinal: Negative for nausea and abdominal pain.  Genitourinary: Negative for frequency, difficulty urinating and vaginal pain.  Musculoskeletal: Negative for back pain and gait problem.  Skin: No rash. Negative for pallor.  Neurological: Negative for dizziness, tremors, weakness, numbness and headaches.  Psychiatric/Behavioral: Positive for sleep disturbance and dysphoric mood - better. Negative for suicidal ideas and confusion.    Objective:   Physical Exam  Constitutional: She appears well-developed. No distress.  Obese  HENT:  Head: Normocephalic.  Right Ear: External ear normal.  Left Ear: External ear normal.  Nose: Nose normal.  Mouth/Throat: Oropharynx is clear and moist.  Eyes: Conjunctivae are normal. Pupils are equal, round, and reactive to light. Right eye exhibits no discharge. Left eye exhibits no discharge.  Neck: Normal range of motion. Neck supple. No JVD present. No tracheal deviation present. No thyromegaly present.  Cardiovascular: Normal rate, regular rhythm and normal heart  sounds.  Pulmonary/Chest: No stridor. No respiratory distress. She has no wheezes.  Abdominal: Soft. Bowel sounds are normal. She exhibits no distension and no mass. There is no tenderness. There is no rebound and no guarding.  Musculoskeletal: She exhibits no edema and no tenderness.  Lymphadenopathy:  She has no cervical adenopathy.  Neurological: She displays normal reflexes. No cranial nerve deficit. She exhibits normal muscle tone. Coordination normal.  Skin: No rash noted.  Psychiatric: Her behavior is normal. Judgment and thought content normal.  Depressed, tearfull  Not suicidal   Review of Systems     Objective:   Physical Exam   Lab Results  Component Value Date   WBC 6.6 05/18/2012   HGB 12.3 05/18/2012   HCT 38.3 05/18/2012   PLT 524.0* 05/18/2012   ALT 20 05/18/2012   AST 22 05/18/2012   NA 138 05/18/2012   K 4.0 05/18/2012   CL 103 05/18/2012   CREATININE 0.8 05/18/2012   BUN 10 05/18/2012   CO2 30 05/18/2012   TSH 1.56 05/18/2012         Assessment & Plan:

## 2013-03-01 DIAGNOSIS — Z0000 Encounter for general adult medical examination without abnormal findings: Secondary | ICD-10-CM | POA: Insufficient documentation

## 2013-03-01 LAB — VITAMIN D 25 HYDROXY (VIT D DEFICIENCY, FRACTURES): Vit D, 25-Hydroxy: 64 ng/mL (ref 30–89)

## 2013-03-01 NOTE — Assessment & Plan Note (Signed)
We discussed age appropriate health related issues, including available/recomended screening tests and vaccinations. We discussed a need for adhering to healthy diet and exercise. Labs/EKG were reviewed/ordered. All questions were answered.   

## 2013-03-30 ENCOUNTER — Telehealth: Payer: Self-pay | Admitting: *Deleted

## 2013-03-30 NOTE — Telephone Encounter (Signed)
Tretinoin 0.025 % cream PA is approved 01/28/13 until 1//2/16. Pharmacy informed. Pt will be informed by plan.

## 2013-04-18 ENCOUNTER — Telehealth: Payer: Self-pay | Admitting: *Deleted

## 2013-04-18 NOTE — Telephone Encounter (Signed)
Patient phoned stating he had emailed PCP and wanted to confirm that MD had received email.  CB# [phone]

## 2013-04-19 NOTE — Telephone Encounter (Signed)
No, I haven't Thx

## 2013-04-26 ENCOUNTER — Telehealth: Payer: Self-pay | Admitting: *Deleted

## 2013-04-26 NOTE — Telephone Encounter (Signed)
Pt needs a letter to go with FMLA. It needs to state "I am treating Margaret Preston for a chronic issue that attends therapy at least once a week. She also can not work more than 40 hours a week due to this chronic issue.   She states she is seeing a counselor for therapy and her job will not allow her to go unless she has a note. Please advise

## 2013-04-27 ENCOUNTER — Encounter: Payer: Self-pay | Admitting: *Deleted

## 2013-04-27 NOTE — Telephone Encounter (Signed)
Ok Pls type Thx 

## 2013-04-27 NOTE — Telephone Encounter (Signed)
Letter written and placed up front for pick up  

## 2013-06-21 ENCOUNTER — Other Ambulatory Visit: Payer: Self-pay | Admitting: *Deleted

## 2013-06-21 MED ORDER — CYANOCOBALAMIN 1000 MCG PO TABS: 100.0000 ug | ORAL_TABLET | Freq: Every day | ORAL | Status: AC

## 2013-06-21 MED ORDER — IBUPROFEN 800 MG PO TABS: 800.0000 mg | ORAL_TABLET | Freq: Three times a day (TID) | ORAL | Status: DC | PRN

## 2013-06-21 MED ORDER — VITAMIN D3 25 MCG (1000 UNIT) PO TABS: 1000.0000 [IU] | ORAL_TABLET | Freq: Every day | ORAL | Status: DC

## 2013-08-29 ENCOUNTER — Other Ambulatory Visit: Payer: Self-pay | Admitting: Internal Medicine

## 2013-09-14 ENCOUNTER — Other Ambulatory Visit: Payer: Self-pay | Admitting: Internal Medicine

## 2013-10-24 ENCOUNTER — Other Ambulatory Visit: Payer: Self-pay | Admitting: Internal Medicine

## 2013-10-27 NOTE — Telephone Encounter (Signed)
Notified pharmacy spoke with Baxter HireKristen gave md approval.../lmb

## 2014-02-26 ENCOUNTER — Other Ambulatory Visit: Payer: Self-pay | Admitting: Internal Medicine

## 2014-06-27 ENCOUNTER — Other Ambulatory Visit: Payer: Self-pay | Admitting: Internal Medicine

## 2014-07-11 ENCOUNTER — Telehealth: Payer: Self-pay | Admitting: *Deleted

## 2014-07-11 NOTE — Telephone Encounter (Signed)
Retin-A (Tretinoin) PA is approved from 05/06/14 through 07/05/15. Pt informed by ins. I informed pharmacy.

## 2014-08-31 ENCOUNTER — Telehealth: Payer: Self-pay | Admitting: Internal Medicine

## 2014-08-31 NOTE — Telephone Encounter (Signed)
Ok Thx 

## 2014-08-31 NOTE — Telephone Encounter (Signed)
Patient states she is on travel and had to go to the doctor.  States she needed to be on a medicine for her thyroid.  She has lab work she is going to fax over for review.

## 2014-08-31 NOTE — Telephone Encounter (Signed)
Fax received/given to PCP.

## 2014-08-31 NOTE — Telephone Encounter (Signed)
Pt has never been on thyroid medication. She wants generic synthroid sent to her [facility] [address]. Please advise.

## 2014-09-03 NOTE — Telephone Encounter (Signed)
Patient mom stated that the pharmacy haven't received the prescription for the synthroid, please advise [phone] [facility]

## 2014-09-03 NOTE — Telephone Encounter (Signed)
A thyroid test was nl. No need for a thyroid med Thx

## 2014-09-03 NOTE — Telephone Encounter (Signed)
After reviewing labs pt had faxed to our office on 08/31/14--Do you want to change/add any meds at this time? Please advise.

## 2014-09-04 NOTE — Telephone Encounter (Signed)
Pt's mother, Mrs. Lajuana RippleMoyer informed. She states she will inform pt.

## 2014-09-05 HISTORY — PX: LAPAROSCOPIC GASTRIC SLEEVE RESECTION: SHX5895

## 2014-09-24 ENCOUNTER — Other Ambulatory Visit: Payer: Self-pay | Admitting: Internal Medicine

## 2015-02-03 ENCOUNTER — Other Ambulatory Visit: Payer: Self-pay | Admitting: Internal Medicine

## 2015-06-19 ENCOUNTER — Ambulatory Visit (INDEPENDENT_AMBULATORY_CARE_PROVIDER_SITE_OTHER): Payer: Federal, State, Local not specified - PPO | Admitting: Internal Medicine

## 2015-06-19 ENCOUNTER — Encounter: Payer: Self-pay | Admitting: Internal Medicine

## 2015-06-19 ENCOUNTER — Other Ambulatory Visit (INDEPENDENT_AMBULATORY_CARE_PROVIDER_SITE_OTHER): Payer: Federal, State, Local not specified - PPO

## 2015-06-19 VITALS — BP 110/70 | HR 70 | Ht 67.0 in | Wt 209.0 lb

## 2015-06-19 DIAGNOSIS — E539 Vitamin B deficiency, unspecified: Secondary | ICD-10-CM

## 2015-06-19 DIAGNOSIS — E039 Hypothyroidism, unspecified: Secondary | ICD-10-CM

## 2015-06-19 DIAGNOSIS — E538 Deficiency of other specified B group vitamins: Secondary | ICD-10-CM

## 2015-06-19 DIAGNOSIS — Z9884 Bariatric surgery status: Secondary | ICD-10-CM

## 2015-06-19 DIAGNOSIS — D649 Anemia, unspecified: Secondary | ICD-10-CM

## 2015-06-19 DIAGNOSIS — E785 Hyperlipidemia, unspecified: Secondary | ICD-10-CM

## 2015-06-19 DIAGNOSIS — E038 Other specified hypothyroidism: Secondary | ICD-10-CM | POA: Diagnosis not present

## 2015-06-19 DIAGNOSIS — L509 Urticaria, unspecified: Secondary | ICD-10-CM

## 2015-06-19 DIAGNOSIS — E034 Atrophy of thyroid (acquired): Secondary | ICD-10-CM

## 2015-06-19 DIAGNOSIS — E559 Vitamin D deficiency, unspecified: Secondary | ICD-10-CM

## 2015-06-19 LAB — BASIC METABOLIC PANEL
BUN: 10 mg/dL (ref 6–23)
CO2: 30 mEq/L (ref 19–32)
Calcium: 9.8 mg/dL (ref 8.4–10.5)
Chloride: 103 mEq/L (ref 96–112)
Creatinine, Ser: 0.71 mg/dL (ref 0.40–1.20)
GFR: 109.5 mL/min (ref >60.00)
Glucose, Bld: 89 mg/dL (ref 70–99)
Potassium: 3.5 mEq/L (ref 3.5–5.1)
Sodium: 138 mEq/L (ref 135–145)

## 2015-06-19 LAB — URINALYSIS
BILIRUBIN URINE: NEGATIVE
HGB URINE DIPSTICK: NEGATIVE
KETONES UR: NEGATIVE
Leukocytes, UA: NEGATIVE
NITRITE: NEGATIVE
Specific Gravity, Urine: >=1.03 — AB (ref 1.000–1.030)
TOTAL PROTEIN, URINE-UPE24: NEGATIVE
Urine Glucose: NEGATIVE
Urobilinogen, UA: 1 (ref 0.0–1.0)
pH: 5.5 (ref 5.0–8.0)

## 2015-06-19 LAB — CBC WITH DIFFERENTIAL/PLATELET
BASOS ABS: 0.1 10*3/uL (ref 0.0–0.1)
BASOS PCT: 1.1 % (ref 0.0–3.0)
EOS ABS: 0.4 10*3/uL (ref 0.0–0.7)
Eosinophils Relative: 5.3 % — ABNORMAL HIGH (ref 0.0–5.0)
HEMATOCRIT: 36.3 % (ref 36.0–46.0)
HEMOGLOBIN: 12.1 g/dL (ref 12.0–15.0)
LYMPHS PCT: 34.4 % (ref 12.0–46.0)
Lymphs Abs: 2.5 10*3/uL (ref 0.7–4.0)
MCHC: 33.2 g/dL (ref 30.0–36.0)
MCV: 90 fl (ref 78.0–100.0)
Monocytes Absolute: 0.9 10*3/uL (ref 0.1–1.0)
Monocytes Relative: 12.2 % — ABNORMAL HIGH (ref 3.0–12.0)
Neutro Abs: 3.4 10*3/uL (ref 1.4–7.7)
Neutrophils Relative %: 47 % (ref 43.0–77.0)
Platelets: 494 10*3/uL — ABNORMAL HIGH (ref 150.0–400.0)
RBC: 4.03 Mil/uL (ref 3.87–5.11)
RDW: 18.6 % — AB (ref 11.5–15.5)
WBC: 7.3 10*3/uL (ref 4.0–10.5)

## 2015-06-19 LAB — HEPATIC FUNCTION PANEL
ALK PHOS: 207 U/L — AB (ref 39–117)
ALT: 17 U/L (ref 0–35)
AST: 26 U/L (ref 0–37)
Albumin: 3.5 g/dL (ref 3.5–5.2)
BILIRUBIN DIRECT: 0.3 mg/dL (ref 0.0–0.3)
TOTAL PROTEIN: 8.5 g/dL — AB (ref 6.0–8.3)
Total Bilirubin: 0.6 mg/dL (ref 0.2–1.2)

## 2015-06-19 LAB — VITAMIN B12: VITAMIN B 12: 313 pg/mL (ref 211–911)

## 2015-06-19 LAB — URIC ACID: Uric Acid, Serum: 7.2 mg/dL — ABNORMAL HIGH (ref 2.4–7.0)

## 2015-06-19 LAB — LIPID PANEL
Cholesterol: 127 mg/dL (ref 0–200)
HDL: 48.5 mg/dL (ref >39.00)
LDL Cholesterol: 65 mg/dL (ref 0–99)
NonHDL: 78.36
Total CHOL/HDL Ratio: 3
Triglycerides: 66 mg/dL (ref 0.0–149.0)
VLDL: 13.2 mg/dL (ref 0.0–40.0)

## 2015-06-19 LAB — VITAMIN D 25 HYDROXY (VIT D DEFICIENCY, FRACTURES): VITD: 29.69 ng/mL — AB (ref 30.00–100.00)

## 2015-06-19 LAB — TSH: TSH: 2.69 u[IU]/mL (ref 0.35–4.50)

## 2015-06-19 MED ORDER — LORAZEPAM 2 MG PO TABS: ORAL_TABLET | ORAL | Status: DC

## 2015-06-19 MED ORDER — ERGOCALCIFEROL 1.25 MG (50000 UT) PO CAPS: 50000.0000 [IU] | ORAL_CAPSULE | ORAL | Status: DC

## 2015-06-19 MED ORDER — PANTOPRAZOLE SODIUM 40 MG PO TBEC: 40.0000 mg | DELAYED_RELEASE_TABLET | Freq: Every day | ORAL | Status: DC

## 2015-06-19 NOTE — Assessment & Plan Note (Signed)
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

## 2015-06-19 NOTE — Assessment & Plan Note (Signed)
Vit D 

## 2015-06-19 NOTE — Progress Notes (Signed)
Subjective:  Patient ID: Margaret Preston, female    DOB: 03-12-1960  Age: 55 y.o. MRN: [id number]  CC: Annual Exam   HPI Margaret Preston presents for cough x months F/u Vit D and Vit B12 def, GERD - out of med  Outpatient Prescriptions Prior to Visit  Medication Sig Dispense Refill  . cholecalciferol (VITAMIN D) 1000 UNITS tablet Take 1 tablet (1,000 Units total) by mouth daily. 100 tablet 3  . Cholecalciferol 50000 UNITS capsule Take 50,000 Units by mouth once a week.    . cyanocobalamin 1000 MCG tablet Take 0.5 tablets (500 mcg total) by mouth daily. 100 tablet 3  . LORazepam (ATIVAN) 2 MG tablet TAKE 1/2 TO 1 TABLET BY MOUTH TWICE DAILY AS NEEDED 180 tablet 3  . pantoprazole (PROTONIX) 40 MG tablet Take 1 tablet (40 mg total) by mouth daily. For indigestion 90 tablet 3  . escitalopram (LEXAPRO) 20 MG tablet TAKE 1 TABLET (20 MG TOTAL) BY MOUTH DAILY. (Patient not taking: Reported on 06/19/2015) 90 tablet 0  . ibuprofen (ADVIL,MOTRIN) 800 MG tablet Take 1 tablet (800 mg total) by mouth every 8 (eight) hours as needed. (Patient not taking: Reported on 06/19/2015) 180 tablet 1  . LORazepam (ATIVAN) 1 MG tablet TAKE 1/2 TO 1 TABLET BY MOUTH TWICE A DAY AS NEEDED FOR ANXIETY (Patient not taking: Reported on 06/19/2015) 180 tablet 1  . traZODone (DESYREL) 50 MG tablet TAKE 1 TABLET BY MOUTH EVERY DAY AT BEDTIME (Patient not taking: Reported on 06/19/2015) 90 tablet 1  . tretinoin (RETIN-A) 0.025 % cream APPLY TOPICALLY AT BEDTIME. ON FACE (Patient not taking: Reported on 06/19/2015) 45 g 0   No facility-administered medications prior to visit.    ROS Review of Systems  Constitutional: Negative for chills, activity change, appetite change, fatigue and unexpected weight change.  HENT: Negative for congestion, mouth sores and sinus pressure.   Eyes: Negative for visual disturbance.  Respiratory: Negative for cough and chest tightness.   Gastrointestinal: Negative for nausea and  abdominal pain.  Genitourinary: Negative for frequency, difficulty urinating and vaginal pain.  Musculoskeletal: Negative for back pain and gait problem.  Skin: Negative for pallor and rash.  Neurological: Negative for dizziness, tremors, weakness, numbness and headaches.  Psychiatric/Behavioral: Negative for suicidal ideas, confusion and sleep disturbance. The patient is not nervous/anxious.     Objective:  BP 110/70 mmHg  Pulse 70  Ht 5\' 7"  (1.702 m)  Wt 209 lb (94.802 kg)  BMI 32.73 kg/m2  SpO2 95%  BP Readings from Last 3 Encounters:  06/19/15 110/70  02/28/13 140/80  05/18/12 120/70    Wt Readings from Last 3 Encounters:  06/19/15 209 lb (94.802 kg)  02/28/13 280 lb (127.007 kg)  05/18/12 285 lb (129.275 kg)    Physical Exam  Constitutional: She appears well-developed. No distress.  HENT:  Head: Normocephalic.  Right Ear: External ear normal.  Left Ear: External ear normal.  Nose: Nose normal.  Mouth/Throat: Oropharynx is clear and moist.  Eyes: Conjunctivae are normal. Pupils are equal, round, and reactive to light. Right eye exhibits no discharge. Left eye exhibits no discharge.  Neck: Normal range of motion. Neck supple. No JVD present. No tracheal deviation present. No thyromegaly present.  Cardiovascular: Normal rate, regular rhythm and normal heart sounds.   Pulmonary/Chest: No stridor. No respiratory distress. She has no wheezes.  Abdominal: Soft. Bowel sounds are normal. She exhibits no distension and no mass. There is no tenderness. There is no rebound  and no guarding.  Musculoskeletal: She exhibits no edema or tenderness.  Lymphadenopathy:    She has no cervical adenopathy.  Neurological: She displays normal reflexes. No cranial nerve deficit. She exhibits normal muscle tone. Coordination normal.  Skin: No rash noted. No erythema.  Psychiatric: She has a normal mood and affect. Her behavior is normal. Judgment and thought content normal.    Lab Results    Component Value Date   WBC 7.6 02/28/2013   HGB 11.8* 02/28/2013   HCT 35.1* 02/28/2013   PLT 472.0* 02/28/2013   GLUCOSE 95 02/28/2013   ALT 23 02/28/2013   AST 22 02/28/2013   NA 138 02/28/2013   K 3.5 02/28/2013   CL 105 02/28/2013   CREATININE 0.6 02/28/2013   BUN 10 02/28/2013   CO2 27 02/28/2013   TSH 1.22 02/28/2013   HGBA1C 5.9 02/28/2013    Dg Chest 2 View  11/24/2010  *RADIOLOGY REPORT* Clinical Data: Cough, hives CHEST - 2 VIEW Comparison:  03/28/2009 Findings:  The heart size and mediastinal contours are within normal limits.  Both lungs are clear.  The visualized skeletal structures are unremarkable. IMPRESSION: No active cardiopulmonary disease. Original Report Authenticated By: Camelia Phenes, M.D.   Assessment & Plan:   There are no diagnoses linked to this encounter. I am having Ms. Parfitt maintain her Cholecalciferol, pantoprazole, cholecalciferol, cyanocobalamin, ibuprofen, LORazepam, traZODone, escitalopram, tretinoin, and LORazepam.  No orders of the defined types were placed in this encounter.     Follow-up: No Follow-up on file.  Sonda Primes, MD

## 2015-06-19 NOTE — Assessment & Plan Note (Signed)
On B12 

## 2015-06-19 NOTE — Assessment & Plan Note (Signed)
TSH 

## 2015-06-19 NOTE — Progress Notes (Signed)
Pre visit review using our clinic review tool, if applicable. No additional management support is needed unless otherwise documented below in the visit note. 

## 2015-06-20 LAB — VITAMIN D 25 HYDROXY (VIT D DEFICIENCY, FRACTURES): Vit D, 25-Hydroxy: 46 ng/mL (ref 30–100)

## 2015-06-23 ENCOUNTER — Encounter: Payer: Self-pay | Admitting: Internal Medicine

## 2015-06-28 ENCOUNTER — Encounter: Payer: Self-pay | Admitting: *Deleted

## 2015-09-09 ENCOUNTER — Other Ambulatory Visit: Payer: Self-pay | Admitting: *Deleted

## 2015-09-09 MED ORDER — ERGOCALCIFEROL 1.25 MG (50000 UT) PO CAPS: 50000.0000 [IU] | ORAL_CAPSULE | ORAL | Status: DC

## 2015-09-20 ENCOUNTER — Ambulatory Visit: Payer: Federal, State, Local not specified - PPO | Admitting: Internal Medicine

## 2015-10-08 ENCOUNTER — Encounter: Payer: Self-pay | Admitting: Internal Medicine

## 2015-10-08 ENCOUNTER — Ambulatory Visit (INDEPENDENT_AMBULATORY_CARE_PROVIDER_SITE_OTHER): Payer: Federal, State, Local not specified - PPO | Admitting: Internal Medicine

## 2015-10-08 ENCOUNTER — Other Ambulatory Visit (INDEPENDENT_AMBULATORY_CARE_PROVIDER_SITE_OTHER): Payer: Federal, State, Local not specified - PPO

## 2015-10-08 VITALS — BP 90/70 | HR 55 | Wt 198.0 lb

## 2015-10-08 DIAGNOSIS — E538 Deficiency of other specified B group vitamins: Secondary | ICD-10-CM | POA: Diagnosis not present

## 2015-10-08 DIAGNOSIS — Z9884 Bariatric surgery status: Secondary | ICD-10-CM | POA: Diagnosis not present

## 2015-10-08 DIAGNOSIS — E559 Vitamin D deficiency, unspecified: Secondary | ICD-10-CM | POA: Diagnosis not present

## 2015-10-08 DIAGNOSIS — H8110 Benign paroxysmal vertigo, unspecified ear: Secondary | ICD-10-CM | POA: Insufficient documentation

## 2015-10-08 DIAGNOSIS — H8111 Benign paroxysmal vertigo, right ear: Secondary | ICD-10-CM

## 2015-10-08 DIAGNOSIS — R519 Headache, unspecified: Secondary | ICD-10-CM | POA: Insufficient documentation

## 2015-10-08 DIAGNOSIS — R51 Headache: Secondary | ICD-10-CM | POA: Diagnosis not present

## 2015-10-08 DIAGNOSIS — R270 Ataxia, unspecified: Secondary | ICD-10-CM

## 2015-10-08 DIAGNOSIS — K219 Gastro-esophageal reflux disease without esophagitis: Secondary | ICD-10-CM

## 2015-10-08 LAB — CBC WITH DIFFERENTIAL/PLATELET
BASOS ABS: 0 10*3/uL (ref 0.0–0.1)
Basophils Relative: 0.5 % (ref 0.0–3.0)
EOS ABS: 0.3 10*3/uL (ref 0.0–0.7)
Eosinophils Relative: 4 % (ref 0.0–5.0)
HCT: 38.4 % (ref 36.0–46.0)
Hemoglobin: 12.4 g/dL (ref 12.0–15.0)
LYMPHS ABS: 3.5 10*3/uL (ref 0.7–4.0)
Lymphocytes Relative: 54.4 % — ABNORMAL HIGH (ref 12.0–46.0)
MCHC: 32.3 g/dL (ref 30.0–36.0)
MCV: 90.7 fl (ref 78.0–100.0)
MONO ABS: 0.7 10*3/uL (ref 0.1–1.0)
Monocytes Relative: 10.3 % (ref 3.0–12.0)
NEUTROS PCT: 30.8 % — AB (ref 43.0–77.0)
Neutro Abs: 2 10*3/uL (ref 1.4–7.7)
Platelets: 422 10*3/uL — ABNORMAL HIGH (ref 150.0–400.0)
RBC: 4.23 Mil/uL (ref 3.87–5.11)
RDW: 16 % — ABNORMAL HIGH (ref 11.5–15.5)
WBC: 6.5 10*3/uL (ref 4.0–10.5)

## 2015-10-08 LAB — BASIC METABOLIC PANEL
BUN: 11 mg/dL (ref 6–23)
CHLORIDE: 101 meq/L (ref 96–112)
CO2: 30 mEq/L (ref 19–32)
CREATININE: 0.76 mg/dL (ref 0.40–1.20)
Calcium: 10 mg/dL (ref 8.4–10.5)
GFR: 101.12 mL/min (ref >60.00)
Glucose, Bld: 85 mg/dL (ref 70–99)
POTASSIUM: 3.6 meq/L (ref 3.5–5.1)
SODIUM: 138 meq/L (ref 135–145)

## 2015-10-08 LAB — CORTISOL: CORTISOL PLASMA: 11.7 ug/dL

## 2015-10-08 LAB — TSH: TSH: 4.1 u[IU]/mL (ref 0.35–4.50)

## 2015-10-08 LAB — VITAMIN B12: Vitamin B-12: 280 pg/mL (ref 211–911)

## 2015-10-08 LAB — VITAMIN D 25 HYDROXY (VIT D DEFICIENCY, FRACTURES): VITD: 48.9 ng/mL (ref 30.00–100.00)

## 2015-10-08 MED ORDER — KETOROLAC TROMETHAMINE 60 MG/2ML IM SOLN
60.0000 mg | Freq: Once | INTRAMUSCULAR | Status: AC
  Administered 2015-10-08: 60 mg via INTRAMUSCULAR

## 2015-10-08 MED ORDER — PANTOPRAZOLE SODIUM 40 MG PO TBEC: 40.0000 mg | DELAYED_RELEASE_TABLET | Freq: Every day | ORAL | 3 refills | Status: DC

## 2015-10-08 MED ORDER — LORAZEPAM 2 MG PO TABS
ORAL_TABLET | ORAL | 1 refills | Status: DC
Start: 2015-10-08 — End: 2016-10-15

## 2015-10-08 MED ORDER — MECLIZINE HCL 12.5 MG PO TABS: 12.5000 mg | ORAL_TABLET | Freq: Three times a day (TID) | ORAL | 1 refills | Status: AC | PRN

## 2015-10-08 NOTE — Assessment & Plan Note (Signed)
On Protonix  Potential benefits of a long term PPI use as well as potential risks  and complications were explained to the patient and were aknowledged. H2 blockers don't work

## 2015-10-08 NOTE — Progress Notes (Signed)
Subjective:  Patient ID: Margaret Preston, female    DOB: Aug 31, 1960  Age: 55 y.o. MRN: [id number]  CC: Dizziness (x months, worsening lately); Cough (x months); and Fatigue   HPI Margaret Preston presents for fatigue, dizziness, insomnia. He fell once during her dizzy spell. S/p gastric sleeve. C/o vertigo x few days. She has a HA and nausea now.  Outpatient Medications Prior to Visit  Medication Sig Dispense Refill  . cholecalciferol (VITAMIN D) 1000 UNITS tablet Take 1 tablet (1,000 Units total) by mouth daily. 100 tablet 3  . cyanocobalamin 1000 MCG tablet Take 0.5 tablets (500 mcg total) by mouth daily. 100 tablet 3  . ergocalciferol (VITAMIN D2) 50000 units capsule Take 1 capsule (50,000 Units total) by mouth once a week. 6 capsule 2  . LORazepam (ATIVAN) 2 MG tablet TAKE 1/2 TO 1 TABLET BY MOUTH TWICE DAILY AS NEEDED 180 tablet 1  . pantoprazole (PROTONIX) 40 MG tablet Take 1 tablet (40 mg total) by mouth daily. For indigestion 90 tablet 3  . tretinoin (RETIN-A) 0.025 % cream APPLY TOPICALLY AT BEDTIME. ON FACE 45 g 0  . ibuprofen (ADVIL,MOTRIN) 800 MG tablet Take 1 tablet (800 mg total) by mouth every 8 (eight) hours as needed. (Patient not taking: Reported on 10/08/2015) 180 tablet 1  . traZODone (DESYREL) 50 MG tablet TAKE 1 TABLET BY MOUTH EVERY DAY AT BEDTIME (Patient not taking: Reported on 10/08/2015) 90 tablet 1   No facility-administered medications prior to visit.     ROS Review of Systems  Constitutional: Positive for fatigue and unexpected weight change. Negative for activity change, appetite change and chills.  HENT: Negative for congestion, mouth sores and sinus pressure.   Eyes: Negative for visual disturbance.  Respiratory: Negative for cough and chest tightness.   Gastrointestinal: Negative for abdominal pain and nausea.  Genitourinary: Negative for difficulty urinating, frequency and vaginal pain.  Musculoskeletal: Negative for back pain and gait problem.    Skin: Negative for pallor and rash.  Neurological: Positive for dizziness and weakness. Negative for tremors, numbness and headaches.  Psychiatric/Behavioral: Negative for confusion and sleep disturbance.    Objective:  BP 90/70   Pulse (!) 55   Wt 198 lb (89.8 kg)   SpO2 94%   BMI 31.01 kg/m   BP Readings from Last 3 Encounters:  10/08/15 90/70  06/19/15 110/70  02/28/13 140/80    Wt Readings from Last 3 Encounters:  10/08/15 198 lb (89.8 kg)  06/19/15 209 lb (94.8 kg)  02/28/13 280 lb (127 kg)    Physical Exam  Constitutional: She appears well-developed. No distress.  HENT:  Head: Normocephalic.  Right Ear: External ear normal.  Left Ear: External ear normal.  Nose: Nose normal.  Mouth/Throat: Oropharynx is clear and moist.  Eyes: Conjunctivae are normal. Pupils are equal, round, and reactive to light. Right eye exhibits no discharge. Left eye exhibits no discharge.  Neck: Normal range of motion. Neck supple. No JVD present. No tracheal deviation present. No thyromegaly present.  Cardiovascular: Normal rate, regular rhythm and normal heart sounds.   Pulmonary/Chest: No stridor. No respiratory distress. She has no wheezes.  Abdominal: Soft. Bowel sounds are normal. She exhibits no distension and no mass. There is no tenderness. There is no rebound and no guarding.  Musculoskeletal: She exhibits tenderness. She exhibits no edema.  Lymphadenopathy:    She has no cervical adenopathy.  Neurological: She displays normal reflexes. No cranial nerve deficit. She exhibits normal muscle tone. Coordination normal.  Skin: No rash noted. No erythema.  Psychiatric: She has a normal mood and affect. Her behavior is normal. Judgment and thought content normal.  H-P (+) R ataxic  Lab Results  Component Value Date   WBC 7.3 06/19/2015   HGB 12.1 06/19/2015   HCT 36.3 06/19/2015   PLT 494.0 (H) 06/19/2015   GLUCOSE 89 06/19/2015   CHOL 127 06/19/2015   TRIG 66.0 06/19/2015    HDL 48.50 06/19/2015   LDLCALC 65 06/19/2015   ALT 17 06/19/2015   AST 26 06/19/2015   NA 138 06/19/2015   K 3.5 06/19/2015   CL 103 06/19/2015   CREATININE 0.71 06/19/2015   BUN 10 06/19/2015   CO2 30 06/19/2015   TSH 2.69 06/19/2015   HGBA1C 5.9 02/28/2013    Dg Chest 2 View  Result Date: 11/24/2010 *RADIOLOGY REPORT* Clinical Data: Cough, hives CHEST - 2 VIEW Comparison:  03/28/2009 Findings:  The heart size and mediastinal contours are within normal limits.  Both lungs are clear.  The visualized skeletal structures are unremarkable. IMPRESSION: No active cardiopulmonary disease. Original Report Authenticated By: Camelia Phenes, M.D.   Assessment & Plan:   There are no diagnoses linked to this encounter. I am having Margaret Preston maintain her cholecalciferol, cyanocobalamin, ibuprofen, traZODone, tretinoin, pantoprazole, LORazepam, and ergocalciferol.  No orders of the defined types were placed in this encounter.    Follow-up: No Follow-up on file.  Sonda Primes, MD

## 2015-10-08 NOTE — Assessment & Plan Note (Addendum)
Toradol 60 mg IM Brain MRI

## 2015-10-08 NOTE — Assessment & Plan Note (Addendum)
Benign Positional Vertigo symptoms on the right Start Meclizine. Start Francee Piccolo - Daroff exercise several times a day as dirrected. No driving MRI head if worse

## 2015-10-08 NOTE — Assessment & Plan Note (Signed)
On Vit D 

## 2015-10-08 NOTE — Progress Notes (Signed)
Pre visit review using our clinic review tool, if applicable. No additional management support is needed unless otherwise documented below in the visit note. 

## 2015-10-08 NOTE — Patient Instructions (Signed)
Benign Positional Vertigo symptoms on the right Start Meclizine. Start Brandt - Daroff exercise several times a day as dirrected.  

## 2015-10-08 NOTE — Assessment & Plan Note (Signed)
TSH ?error

## 2015-10-08 NOTE — Assessment & Plan Note (Addendum)
8/17 x weeks - worse MRI brain was ordered

## 2015-10-08 NOTE — Assessment & Plan Note (Signed)
Labs

## 2015-10-10 ENCOUNTER — Telehealth: Payer: Self-pay

## 2015-10-10 ENCOUNTER — Telehealth: Payer: Self-pay | Admitting: Internal Medicine

## 2015-10-10 NOTE — Telephone Encounter (Signed)
PA initiated via CoverMyMeds key [facility]

## 2015-10-10 NOTE — Telephone Encounter (Signed)
Pt informed - see 10/08/15 labs.

## 2015-10-10 NOTE — Telephone Encounter (Signed)
Patient requesting call back with lab results.

## 2015-10-14 NOTE — Telephone Encounter (Signed)
PA for Protonix DENIED - brand medication is not covered medication for [employer] members in 2017- generic alternative are available, please advise

## 2015-10-14 NOTE — Telephone Encounter (Signed)
Ok generic Thx 

## 2015-10-16 MED ORDER — PANTOPRAZOLE SODIUM 40 MG PO TBEC: 40.0000 mg | DELAYED_RELEASE_TABLET | Freq: Every day | ORAL | 3 refills | Status: DC

## 2015-10-16 NOTE — Telephone Encounter (Signed)
Patient advised and will follow up with Insurance plan as needed

## 2015-10-21 ENCOUNTER — Other Ambulatory Visit: Payer: Self-pay | Admitting: Internal Medicine

## 2015-10-21 DIAGNOSIS — R270 Ataxia, unspecified: Secondary | ICD-10-CM

## 2015-10-31 ENCOUNTER — Encounter: Payer: Self-pay | Admitting: Internal Medicine

## 2015-10-31 ENCOUNTER — Ambulatory Visit (INDEPENDENT_AMBULATORY_CARE_PROVIDER_SITE_OTHER): Payer: Federal, State, Local not specified - PPO | Admitting: Internal Medicine

## 2015-10-31 DIAGNOSIS — F329 Major depressive disorder, single episode, unspecified: Secondary | ICD-10-CM

## 2015-10-31 DIAGNOSIS — E538 Deficiency of other specified B group vitamins: Secondary | ICD-10-CM | POA: Diagnosis not present

## 2015-10-31 DIAGNOSIS — E559 Vitamin D deficiency, unspecified: Secondary | ICD-10-CM | POA: Diagnosis not present

## 2015-10-31 DIAGNOSIS — F411 Generalized anxiety disorder: Secondary | ICD-10-CM | POA: Diagnosis not present

## 2015-10-31 DIAGNOSIS — F32A Depression, unspecified: Secondary | ICD-10-CM

## 2015-10-31 MED ORDER — FLUOXETINE HCL 20 MG PO CAPS: 20.0000 mg | ORAL_CAPSULE | Freq: Every day | ORAL | 1 refills | Status: DC

## 2015-10-31 MED ORDER — VITAMIN B-1 100 MG PO TABS: 100.0000 mg | ORAL_TABLET | Freq: Every day | ORAL | 1 refills | Status: DC

## 2015-10-31 MED ORDER — CYANOCOBALAMIN 1000 MCG/ML IJ SOLN: 1000.0000 ug | Freq: Once | INTRAMUSCULAR | 0 refills | Status: DC

## 2015-10-31 MED ORDER — TRAZODONE HCL 100 MG PO TABS: 100.0000 mg | ORAL_TABLET | Freq: Every day | ORAL | 1 refills | Status: DC

## 2015-10-31 MED ORDER — CYANOCOBALAMIN 1000 MCG/ML IJ SOLN
1000.0000 ug | Freq: Once | INTRAMUSCULAR | Status: AC
  Administered 2015-10-31: 1000 ug via INTRAMUSCULAR

## 2015-10-31 NOTE — Assessment & Plan Note (Signed)
Resolving

## 2015-10-31 NOTE — Assessment & Plan Note (Addendum)
8/17 worse - 1 week inpt treatment Fluoxetine, Trazodone Ref to see Berniece AndreasJulie Whitt

## 2015-10-31 NOTE — Progress Notes (Signed)
Subjective:  Patient ID: Margaret Preston, female    DOB: January 19, 1961  Age: 55 y.o. MRN: [id number]  CC: Hospitalization Follow-up (anxiety attack-pt states doing better)   HPI Margaret Preston presents for depression: 1 week inpatient treatment in [place] - back on Fluoxetine and Trazadone  Outpatient Medications Prior to Visit  Medication Sig Dispense Refill  . cholecalciferol (VITAMIN D) 1000 UNITS tablet Take 1 tablet (1,000 Units total) by mouth daily. 100 tablet 3  . cyanocobalamin 1000 MCG tablet Take 0.5 tablets (500 mcg total) by mouth daily. 100 tablet 3  . ergocalciferol (VITAMIN D2) 50000 units capsule Take 1 capsule (50,000 Units total) by mouth once a week. 6 capsule 2  . LORazepam (ATIVAN) 2 MG tablet TAKE 1/2 TO 1 TABLET BY MOUTH TWICE DAILY AS NEEDED 180 tablet 1  . meclizine (ANTIVERT) 12.5 MG tablet Take 1 tablet (12.5 mg total) by mouth 3 (three) times daily as needed for dizziness or nausea. 60 tablet 1  . pantoprazole (PROTONIX) 40 MG tablet Take 1 tablet (40 mg total) by mouth daily. For indigestion 90 tablet 3  . tretinoin (RETIN-A) 0.025 % cream APPLY TOPICALLY AT BEDTIME. ON FACE 45 g 0  . traZODone (DESYREL) 50 MG tablet TAKE 1 TABLET BY MOUTH EVERY DAY AT BEDTIME 90 tablet 1  . ibuprofen (ADVIL,MOTRIN) 800 MG tablet Take 1 tablet (800 mg total) by mouth every 8 (eight) hours as needed. (Patient not taking: Reported on 10/08/2015) 180 tablet 1   No facility-administered medications prior to visit.     ROS Review of Systems  Constitutional: Negative for activity change, appetite change, chills, fatigue and unexpected weight change.  HENT: Negative for congestion, mouth sores and sinus pressure.   Eyes: Negative for visual disturbance.  Respiratory: Negative for cough and chest tightness.   Gastrointestinal: Negative for abdominal pain and nausea.  Genitourinary: Negative for difficulty urinating, frequency and vaginal pain.  Musculoskeletal: Negative for  back pain and gait problem.  Skin: Negative for pallor and rash.  Neurological: Negative for dizziness, tremors, weakness, numbness and headaches.  Psychiatric/Behavioral: Positive for decreased concentration and dysphoric mood. Negative for confusion, sleep disturbance and suicidal ideas. The patient is nervous/anxious.     Objective:  BP 100/70 (BP Location: Left Arm, Patient Position: Sitting, Cuff Size: Large)   Pulse 67   Temp 97.6 F (36.4 C) (Oral)   Ht 5\' 7"  (1.702 m)   Wt 196 lb 1.3 oz (88.9 kg)   SpO2 96%   BMI 30.71 kg/m   BP Readings from Last 3 Encounters:  10/31/15 100/70  10/08/15 90/70  06/19/15 110/70    Wt Readings from Last 3 Encounters:  10/31/15 196 lb 1.3 oz (88.9 kg)  10/08/15 198 lb (89.8 kg)  06/19/15 209 lb (94.8 kg)    Physical Exam  Constitutional: She appears well-developed. No distress.  HENT:  Head: Normocephalic.  Right Ear: External ear normal.  Left Ear: External ear normal.  Nose: Nose normal.  Mouth/Throat: Oropharynx is clear and moist.  Eyes: Conjunctivae are normal. Pupils are equal, round, and reactive to light. Right eye exhibits no discharge. Left eye exhibits no discharge.  Neck: Normal range of motion. Neck supple. No JVD present. No tracheal deviation present. No thyromegaly present.  Cardiovascular: Normal rate, regular rhythm and normal heart sounds.   Pulmonary/Chest: No stridor. No respiratory distress. She has no wheezes.  Abdominal: Soft. Bowel sounds are normal. She exhibits no distension and no mass. There is no tenderness. There is  no rebound and no guarding.  Musculoskeletal: She exhibits no edema or tenderness.  Lymphadenopathy:    She has no cervical adenopathy.  Neurological: She displays normal reflexes. No cranial nerve deficit. She exhibits normal muscle tone. Coordination normal.  Skin: No rash noted. No erythema.  Psychiatric: Her behavior is normal. Judgment and thought content normal.  Tearful  Lab  Results  Component Value Date   WBC 6.5 10/08/2015   HGB 12.4 10/08/2015   HCT 38.4 10/08/2015   PLT 422.0 (H) 10/08/2015   GLUCOSE 85 10/08/2015   CHOL 127 06/19/2015   TRIG 66.0 06/19/2015   HDL 48.50 06/19/2015   LDLCALC 65 06/19/2015   ALT 17 06/19/2015   AST 26 06/19/2015   NA 138 10/08/2015   K 3.6 10/08/2015   CL 101 10/08/2015   CREATININE 0.76 10/08/2015   BUN 11 10/08/2015   CO2 30 10/08/2015   TSH 4.10 10/08/2015   HGBA1C 5.9 02/28/2013    Dg Chest 2 View  Result Date: 11/24/2010 *RADIOLOGY REPORT* Clinical Data: Cough, hives CHEST - 2 VIEW Comparison:  03/28/2009 Findings:  The heart size and mediastinal contours are within normal limits.  Both lungs are clear.  The visualized skeletal structures are unremarkable. IMPRESSION: No active cardiopulmonary disease. Original Report Authenticated By: Camelia Phenes, M.D.   Assessment & Plan:   There are no diagnoses linked to this encounter. I am having Ms. Schleifer maintain her cholecalciferol, cyanocobalamin, ibuprofen, tretinoin, ergocalciferol, LORazepam, meclizine, pantoprazole, FLUoxetine, and traZODone.  Meds ordered this encounter  Medications  . FLUoxetine (PROZAC) 20 MG capsule    Sig: Take 1 capsule by mouth daily.  . traZODone (DESYREL) 100 MG tablet    Sig: Take 1 tablet by mouth daily.     Follow-up: No Follow-up on file.  Sonda Primes, MD

## 2015-10-31 NOTE — Assessment & Plan Note (Signed)
On Vit D 

## 2015-10-31 NOTE — Assessment & Plan Note (Addendum)
Lorazepam prn  Potential benefits of a long term benzodiazepines  use as well as potential risks  and complications were explained to the patient and were aknowledged. Fluoxetine, Trazodone   Ref to see Berniece AndreasJulie Whitt

## 2015-10-31 NOTE — Assessment & Plan Note (Signed)
Back on B12

## 2015-10-31 NOTE — Progress Notes (Signed)
Pre visit review using our clinic review tool, if applicable. No additional management support is needed unless otherwise documented below in the visit note. 

## 2015-11-05 ENCOUNTER — Ambulatory Visit: Payer: Federal, State, Local not specified - PPO | Admitting: Internal Medicine

## 2015-12-27 ENCOUNTER — Ambulatory Visit: Payer: Federal, State, Local not specified - PPO | Admitting: Licensed Clinical Social Worker

## 2016-03-23 ENCOUNTER — Encounter: Payer: Self-pay | Admitting: Internal Medicine

## 2016-03-23 ENCOUNTER — Ambulatory Visit (INDEPENDENT_AMBULATORY_CARE_PROVIDER_SITE_OTHER): Payer: Federal, State, Local not specified - PPO | Admitting: Internal Medicine

## 2016-03-23 VITALS — BP 132/90 | HR 68 | Wt 185.0 lb

## 2016-03-23 DIAGNOSIS — E538 Deficiency of other specified B group vitamins: Secondary | ICD-10-CM

## 2016-03-23 MED ORDER — CYANOCOBALAMIN 1000 MCG/ML IJ SOLN
1000.0000 ug | Freq: Once | INTRAMUSCULAR | Status: AC
  Administered 2016-03-23: 1000 ug via INTRAMUSCULAR

## 2016-03-23 MED ORDER — ERGOCALCIFEROL 1.25 MG (50000 UT) PO CAPS: 50000.0000 [IU] | ORAL_CAPSULE | ORAL | 2 refills | Status: DC

## 2016-03-23 MED ORDER — FLUOXETINE HCL 40 MG PO CAPS: 40.0000 mg | ORAL_CAPSULE | Freq: Every day | ORAL | 3 refills | Status: DC

## 2016-03-23 MED ORDER — CARIPRAZINE HCL 1.5 MG PO CAPS
1.5000 mg | ORAL_CAPSULE | Freq: Every day | ORAL | 5 refills | Status: DC
Start: 2016-03-23 — End: 2016-11-03

## 2016-03-23 MED ORDER — OMEPRAZOLE 40 MG PO CPDR: 40.0000 mg | DELAYED_RELEASE_CAPSULE | Freq: Every day | ORAL | 11 refills | Status: DC

## 2016-03-23 NOTE — Progress Notes (Signed)
Subjective:  Patient ID: Margaret Preston, female    DOB: 1961/01/03  Age: 56 y.o. MRN: [id number]  CC: No chief complaint on file.   HPI Margaret Preston presents for a job stress, worsening depression  Outpatient Medications Prior to Visit  Medication Sig Dispense Refill  . cholecalciferol (VITAMIN D) 1000 UNITS tablet Take 1 tablet (1,000 Units total) by mouth daily. 100 tablet 3  . cyanocobalamin 1000 MCG tablet Take 0.5 tablets (500 mcg total) by mouth daily. 100 tablet 3  . ergocalciferol (VITAMIN D2) 50000 units capsule Take 1 capsule (50,000 Units total) by mouth once a week. 6 capsule 2  . FLUoxetine (PROZAC) 20 MG capsule Take 1 capsule (20 mg total) by mouth daily. 90 capsule 1  . ibuprofen (ADVIL,MOTRIN) 800 MG tablet Take 1 tablet (800 mg total) by mouth every 8 (eight) hours as needed. 180 tablet 1  . LORazepam (ATIVAN) 2 MG tablet TAKE 1/2 TO 1 TABLET BY MOUTH TWICE DAILY AS NEEDED 180 tablet 1  . meclizine (ANTIVERT) 12.5 MG tablet Take 1 tablet (12.5 mg total) by mouth 3 (three) times daily as needed for dizziness or nausea. 60 tablet 1  . pantoprazole (PROTONIX) 40 MG tablet Take 1 tablet (40 mg total) by mouth daily. For indigestion 90 tablet 3  . thiamine (VITAMIN B-1) 100 MG tablet Take 1 tablet (100 mg total) by mouth daily. 100 tablet 1  . traZODone (DESYREL) 100 MG tablet Take 1 tablet (100 mg total) by mouth at bedtime. 90 tablet 1  . tretinoin (RETIN-A) 0.025 % cream APPLY TOPICALLY AT BEDTIME. ON FACE 45 g 0   No facility-administered medications prior to visit.     ROS Review of Systems  Constitutional: Positive for fatigue. Negative for activity change, appetite change, chills and unexpected weight change.  HENT: Negative for congestion, mouth sores and sinus pressure.   Eyes: Negative for visual disturbance.  Respiratory: Negative for cough and chest tightness.   Gastrointestinal: Negative for abdominal pain and nausea.  Genitourinary: Negative for  difficulty urinating, frequency and vaginal pain.  Musculoskeletal: Negative for back pain and gait problem.  Skin: Negative for pallor and rash.  Neurological: Negative for dizziness, tremors, weakness, numbness and headaches.  Psychiatric/Behavioral: Positive for dysphoric mood, sleep disturbance and suicidal ideas. Negative for confusion. The patient is nervous/anxious.     Objective:  BP 132/90   Pulse 68   Wt 185 lb (83.9 kg)   SpO2 96%   BMI 28.98 kg/m   BP Readings from Last 3 Encounters:  03/23/16 132/90  10/31/15 100/70  10/08/15 90/70    Wt Readings from Last 3 Encounters:  03/23/16 185 lb (83.9 kg)  10/31/15 196 lb 1.3 oz (88.9 kg)  10/08/15 198 lb (89.8 kg)    Physical Exam  Constitutional: She appears well-developed. No distress.  HENT:  Head: Normocephalic.  Right Ear: External ear normal.  Left Ear: External ear normal.  Nose: Nose normal.  Mouth/Throat: Oropharynx is clear and moist.  Eyes: Conjunctivae are normal. Pupils are equal, round, and reactive to light. Right eye exhibits no discharge. Left eye exhibits no discharge.  Neck: Normal range of motion. Neck supple. No JVD present. No tracheal deviation present. No thyromegaly present.  Cardiovascular: Normal rate, regular rhythm and normal heart sounds.   Pulmonary/Chest: No stridor. No respiratory distress. She has no wheezes.  Abdominal: Soft. Bowel sounds are normal. She exhibits no distension and no mass. There is no tenderness. There is no rebound and no  guarding.  Musculoskeletal: She exhibits no edema or tenderness.  Lymphadenopathy:    She has no cervical adenopathy.  Neurological: She displays normal reflexes. No cranial nerve deficit. She exhibits normal muscle tone. Coordination normal.  Skin: No rash noted. No erythema.  Psychiatric: She has a normal mood and affect. Her behavior is normal. Judgment and thought content normal.    Lab Results  Component Value Date   WBC 6.5 10/08/2015     HGB 12.4 10/08/2015   HCT 38.4 10/08/2015   PLT 422.0 (H) 10/08/2015   GLUCOSE 85 10/08/2015   CHOL 127 06/19/2015   TRIG 66.0 06/19/2015   HDL 48.50 06/19/2015   LDLCALC 65 06/19/2015   ALT 17 06/19/2015   AST 26 06/19/2015   NA 138 10/08/2015   K 3.6 10/08/2015   CL 101 10/08/2015   CREATININE 0.76 10/08/2015   BUN 11 10/08/2015   CO2 30 10/08/2015   TSH 4.10 10/08/2015   HGBA1C 5.9 02/28/2013    Dg Chest 2 View  Result Date: 11/24/2010 *RADIOLOGY REPORT* Clinical Data: Cough, hives CHEST - 2 VIEW Comparison:  03/28/2009 Findings:  The heart size and mediastinal contours are within normal limits.  Both lungs are clear.  The visualized skeletal structures are unremarkable. IMPRESSION: No active cardiopulmonary disease. Original Report Authenticated By: Camelia Phenes, M.D.   Assessment & Plan:   There are no diagnoses linked to this encounter. I am having Margaret Preston maintain her cholecalciferol, cyanocobalamin, ibuprofen, tretinoin, ergocalciferol, LORazepam, meclizine, pantoprazole, traZODone, FLUoxetine, and thiamine.  No orders of the defined types were placed in this encounter.    Follow-up: No Follow-up on file.  Sonda Primes, MD

## 2016-03-23 NOTE — Patient Instructions (Signed)
Is Trintellix covered? 

## 2016-03-23 NOTE — Progress Notes (Signed)
Pre visit review using our clinic review tool, if applicable. No additional management support is needed unless otherwise documented below in the visit note. 

## 2016-04-01 ENCOUNTER — Encounter: Payer: Self-pay | Admitting: Internal Medicine

## 2016-04-01 ENCOUNTER — Ambulatory Visit (INDEPENDENT_AMBULATORY_CARE_PROVIDER_SITE_OTHER): Payer: Federal, State, Local not specified - PPO | Admitting: Internal Medicine

## 2016-04-01 DIAGNOSIS — F411 Generalized anxiety disorder: Secondary | ICD-10-CM | POA: Diagnosis not present

## 2016-04-01 DIAGNOSIS — F3341 Major depressive disorder, recurrent, in partial remission: Secondary | ICD-10-CM | POA: Diagnosis not present

## 2016-04-01 DIAGNOSIS — E538 Deficiency of other specified B group vitamins: Secondary | ICD-10-CM

## 2016-04-01 NOTE — Assessment & Plan Note (Addendum)
Better w/rest and Fluoxetine Consider Trintellix

## 2016-04-01 NOTE — Progress Notes (Signed)
Subjective:  Patient ID: Margaret Preston, female    DOB: 11/18/1960  Age: 56 y.o. MRN: [id number]  CC: No chief complaint on file.   HPI Margaret Preston presents for depression, anxiety stress f/u - feeling better F/u Vit B12 def  Outpatient Medications Prior to Visit  Medication Sig Dispense Refill  . cholecalciferol (VITAMIN D) 1000 UNITS tablet Take 1 tablet (1,000 Units total) by mouth daily. 100 tablet 3  . cyanocobalamin 1000 MCG tablet Take 0.5 tablets (500 mcg total) by mouth daily. 100 tablet 3  . ergocalciferol (VITAMIN D2) 50000 units capsule Take 1 capsule (50,000 Units total) by mouth once a week. 6 capsule 2  . FLUoxetine (PROZAC) 40 MG capsule Take 1 capsule (40 mg total) by mouth daily. 30 capsule 3  . LORazepam (ATIVAN) 2 MG tablet TAKE 1/2 TO 1 TABLET BY MOUTH TWICE DAILY AS NEEDED 180 tablet 1  . meclizine (ANTIVERT) 12.5 MG tablet Take 1 tablet (12.5 mg total) by mouth 3 (three) times daily as needed for dizziness or nausea. 60 tablet 1  . omeprazole (PRILOSEC) 40 MG capsule Take 1 capsule (40 mg total) by mouth daily. 30 capsule 11  . thiamine (VITAMIN B-1) 100 MG tablet Take 1 tablet (100 mg total) by mouth daily. 100 tablet 1  . tretinoin (RETIN-A) 0.025 % cream APPLY TOPICALLY AT BEDTIME. ON FACE 45 g 0  . Cariprazine HCl (VRAYLAR) 1.5 MG CAPS Take 1 capsule (1.5 mg total) by mouth daily. (Patient not taking: Reported on 04/01/2016) 30 capsule 5  . ibuprofen (ADVIL,MOTRIN) 800 MG tablet Take 1 tablet (800 mg total) by mouth every 8 (eight) hours as needed. (Patient not taking: Reported on 04/01/2016) 180 tablet 1   No facility-administered medications prior to visit.     ROS Review of Systems  Constitutional: Positive for fatigue. Negative for activity change, appetite change, chills and unexpected weight change.  HENT: Negative for congestion, mouth sores and sinus pressure.   Eyes: Negative for visual disturbance.  Respiratory: Negative for cough and  chest tightness.   Gastrointestinal: Negative for abdominal pain and nausea.  Genitourinary: Negative for difficulty urinating, frequency and vaginal pain.  Musculoskeletal: Negative for back pain and gait problem.  Skin: Negative for pallor and rash.  Neurological: Negative for dizziness, tremors, weakness, numbness and headaches.  Psychiatric/Behavioral: Negative for confusion, sleep disturbance and suicidal ideas. The patient is nervous/anxious.     Objective:  BP 110/80   Pulse 60   Wt 186 lb (84.4 kg)   SpO2 95%   BMI 29.13 kg/m   BP Readings from Last 3 Encounters:  04/01/16 110/80  03/23/16 132/90  10/31/15 100/70    Wt Readings from Last 3 Encounters:  04/01/16 186 lb (84.4 kg)  03/23/16 185 lb (83.9 kg)  10/31/15 196 lb 1.3 oz (88.9 kg)    Physical Exam  Constitutional: She appears well-developed. No distress.  HENT:  Head: Normocephalic.  Right Ear: External ear normal.  Left Ear: External ear normal.  Nose: Nose normal.  Mouth/Throat: Oropharynx is clear and moist.  Eyes: Conjunctivae are normal. Pupils are equal, round, and reactive to light. Right eye exhibits no discharge. Left eye exhibits no discharge.  Neck: Normal range of motion. Neck supple. No JVD present. No tracheal deviation present. No thyromegaly present.  Cardiovascular: Normal rate, regular rhythm and normal heart sounds.   Pulmonary/Chest: No stridor. No respiratory distress. She has no wheezes.  Abdominal: Soft. Bowel sounds are normal. She exhibits no distension and  no mass. There is no tenderness. There is no rebound and no guarding.  Musculoskeletal: She exhibits no edema or tenderness.  Lymphadenopathy:    She has no cervical adenopathy.  Neurological: She displays normal reflexes. No cranial nerve deficit. She exhibits normal muscle tone. Coordination normal.  Skin: No rash noted. No erythema.  Psychiatric: Her behavior is normal. Judgment and thought content normal.    Lab Results    Component Value Date   WBC 6.5 10/08/2015   HGB 12.4 10/08/2015   HCT 38.4 10/08/2015   PLT 422.0 (H) 10/08/2015   GLUCOSE 85 10/08/2015   CHOL 127 06/19/2015   TRIG 66.0 06/19/2015   HDL 48.50 06/19/2015   LDLCALC 65 06/19/2015   ALT 17 06/19/2015   AST 26 06/19/2015   NA 138 10/08/2015   K 3.6 10/08/2015   CL 101 10/08/2015   CREATININE 0.76 10/08/2015   BUN 11 10/08/2015   CO2 30 10/08/2015   TSH 4.10 10/08/2015   HGBA1C 5.9 02/28/2013    Dg Chest 2 View  Result Date: 11/24/2010 *RADIOLOGY REPORT* Clinical Data: Cough, hives CHEST - 2 VIEW Comparison:  03/28/2009 Findings:  The heart size and mediastinal contours are within normal limits.  Both lungs are clear.  The visualized skeletal structures are unremarkable. IMPRESSION: No active cardiopulmonary disease. Original Report Authenticated By: Camelia Phenes, M.D.   Assessment & Plan:   There are no diagnoses linked to this encounter. I am having Ms. Gargis maintain her cholecalciferol, cyanocobalamin, ibuprofen, tretinoin, LORazepam, meclizine, thiamine, ergocalciferol, FLUoxetine, Cariprazine HCl, and omeprazole.  No orders of the defined types were placed in this encounter.    Follow-up: No Follow-up on file.  Sonda Primes, MD

## 2016-04-01 NOTE — Progress Notes (Signed)
Pre visit review using our clinic review tool, if applicable. No additional management support is needed unless otherwise documented below in the visit note. 

## 2016-04-01 NOTE — Patient Instructions (Signed)
Is Trintellix covered?

## 2016-04-01 NOTE — Assessment & Plan Note (Addendum)
  Ref to see Margaret AndreasJulie Whitt  Better w/rest and Fluoxetine Consider Trintellix

## 2016-04-01 NOTE — Assessment & Plan Note (Signed)
On B12 

## 2016-05-12 ENCOUNTER — Other Ambulatory Visit: Payer: Self-pay | Admitting: *Deleted

## 2016-05-12 MED ORDER — FLUOXETINE HCL 40 MG PO CAPS: 40.0000 mg | ORAL_CAPSULE | Freq: Every day | ORAL | 1 refills | Status: DC

## 2016-05-29 ENCOUNTER — Emergency Department (HOSPITAL_COMMUNITY): Payer: Federal, State, Local not specified - PPO

## 2016-05-29 ENCOUNTER — Ambulatory Visit: Payer: Federal, State, Local not specified - PPO | Admitting: Internal Medicine

## 2016-05-29 ENCOUNTER — Encounter (HOSPITAL_COMMUNITY): Payer: Self-pay

## 2016-05-29 ENCOUNTER — Emergency Department (HOSPITAL_COMMUNITY)
Admission: EM | Admit: 2016-05-29 | Discharge: 2016-05-30 | Disposition: A | Discharge status: Home / Self Care | Payer: Federal, State, Local not specified - PPO | Attending: Emergency Medicine | Admitting: Emergency Medicine

## 2016-05-29 DIAGNOSIS — Z79899 Other long term (current) drug therapy: Secondary | ICD-10-CM | POA: Insufficient documentation

## 2016-05-29 DIAGNOSIS — R51 Headache: Secondary | ICD-10-CM | POA: Diagnosis not present

## 2016-05-29 DIAGNOSIS — W01198A Fall on same level from slipping, tripping and stumbling with subsequent striking against other object, initial encounter: Secondary | ICD-10-CM | POA: Diagnosis not present

## 2016-05-29 DIAGNOSIS — S060X1A Concussion with loss of consciousness of 30 minutes or less, initial encounter: Secondary | ICD-10-CM | POA: Diagnosis not present

## 2016-05-29 DIAGNOSIS — Y92194 Driveway of other specified residential institution as the place of occurrence of the external cause: Secondary | ICD-10-CM | POA: Diagnosis not present

## 2016-05-29 DIAGNOSIS — Z0289 Encounter for other administrative examinations: Secondary | ICD-10-CM

## 2016-05-29 DIAGNOSIS — E039 Hypothyroidism, unspecified: Secondary | ICD-10-CM | POA: Insufficient documentation

## 2016-05-29 DIAGNOSIS — S0181XA Laceration without foreign body of other part of head, initial encounter: Secondary | ICD-10-CM | POA: Insufficient documentation

## 2016-05-29 DIAGNOSIS — Y939 Activity, unspecified: Secondary | ICD-10-CM | POA: Insufficient documentation

## 2016-05-29 DIAGNOSIS — Y999 Unspecified external cause status: Secondary | ICD-10-CM | POA: Diagnosis not present

## 2016-05-29 DIAGNOSIS — S0990XA Unspecified injury of head, initial encounter: Secondary | ICD-10-CM | POA: Diagnosis present

## 2016-05-29 MED ORDER — ACETAMINOPHEN 325 MG PO TABS
650.0000 mg | ORAL_TABLET | Freq: Once | ORAL | Status: AC
  Administered 2016-05-29: 650 mg via ORAL

## 2016-05-29 MED ORDER — OXYCODONE-ACETAMINOPHEN 5-325 MG PO TABS
1.0000 | ORAL_TABLET | Freq: Once | ORAL | Status: AC
  Administered 2016-05-29: 1 via ORAL
  Filled 2016-05-29: qty 1

## 2016-05-29 MED ORDER — ONDANSETRON 4 MG PO TBDP
ORAL_TABLET | ORAL | Status: AC
  Filled 2016-05-29: qty 1

## 2016-05-29 MED ORDER — ONDANSETRON 4 MG PO TBDP
4.0000 mg | ORAL_TABLET | Freq: Once | ORAL | Status: AC
  Administered 2016-05-29: 4 mg via ORAL

## 2016-05-29 MED ORDER — LIDOCAINE HCL (PF) 1 % IJ SOLN
5.0000 mL | Freq: Once | INTRAMUSCULAR | Status: AC
  Administered 2016-05-29: 5 mL
  Filled 2016-05-29: qty 5

## 2016-05-29 MED ORDER — ACETAMINOPHEN 325 MG PO TABS
ORAL_TABLET | ORAL | Status: AC
  Filled 2016-05-29: qty 2

## 2016-05-29 NOTE — ED Notes (Signed)
Pt reports she was getting out of her pickup truck. Pt states she way the blood and passed out. Pt CAO x4.

## 2016-05-29 NOTE — ED Provider Notes (Addendum)
I have personally seen and examined the patient. I have reviewed the documentation on PMH/FH/Soc Hx. I have discussed the plan of care with the resident and patient.  I have reviewed and agree with the resident's documentation. Please see associated encounter note.  I have personally seen and examined the patient.  I have discussed plan of care with resident.  I was present for key and critical portions of procedure as documented. I have reviewed the appropriate documentation on PMH/FH/Soc. History.  I have reviewed the documentation of the resident and agree.     Nira ConnPedro Eduardo Jaydenn Boccio, MD 05/30/16 [id number]

## 2016-05-29 NOTE — ED Triage Notes (Signed)
Pt was getting out of car today, tripped fell and hit right side of forehead of gravel driveway. Pt reports LOC but unsure how long. Friend states when she found her on the ground the blood was already dry. Pt has small lac to right forehead. Denies being on blood thinners

## 2016-05-30 NOTE — ED Provider Notes (Signed)
[facility]-EMER[facility]ENCY DEPT Provider Note   CSN: [id number] Arrival date & time: 05/29/16  1912     History   Chief Complaint Chief Complaint  Patient presents with  . Fall  . Headache    HPI Margaret Preston is a 56 y.o. female.  HPI  56 year old female presenting for evaluation of head injury obtained after falling when attempting to get into her pickup truck. Patient states that she slipped and fell to the ground, hitting her head. Family heard her fall and ran outside. She reportedly lost consciousness for 30 seconds to a minute. Wound present to the right side of the forehead. Patient endorses headache and dizziness. No vomiting, vision changes, neck pain, back pain, pain to the extremities. No prior history of head injury. Patient is not on blood thinners.   Past Medical History:  Diagnosis Date  . Depression   . GERD (gastroesophageal reflux disease)   . Hypothyroidism 2009  . Insomnia   . Obesity   . Pancreatitis 2011  . Vitamin B12 deficiency 2009  . Vitamin D deficiency     Patient Active Problem List   Diagnosis Date Noted  . Benign paroxysmal positional vertigo 10/08/2015  . Headache 10/08/2015  . Ataxia 10/08/2015  . 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 Well adult exam 03/01/2013  . Obesity, morbid (HCC) 05/18/2012  . Anxiety state 05/26/2010  . UPPER RESPIRATORY INFECTION, ACUTE 05/26/2010  . MENORRHAGIA 05/26/2010  . ACNE VULGARIS, FACIAL 07/09/2009  . GERD 07/01/2009  . PANCREATITIS, HX OF 07/01/2009  . MUSCULOSKELETAL PAIN 03/28/2009  . CHEST PAIN UNSPECIFIED 03/28/2009  . Vitamin D deficiency 06/20/2007  . Hypothyroidism 06/15/2007  . B12 deficiency 06/14/2007  . Depression 06/14/2007  . FATIGUE 06/14/2007  . PARESTHESIA 06/14/2007  . Abnormal weight gain 06/14/2007    Past Surgical History:  Procedure Laterality Date  . LAPAROSCOPIC GASTRIC SLEEVE RESECTION  09/05/2014    OB History    No data available       Home  Medications    Prior to Admission medications   Medication Sig Start Date End Date Taking? Authorizing Provider  FLUoxetine (PROZAC) 40 MG capsule Take 1 capsule (40 mg total) by mouth daily. 05/12/16  Yes Aleksei Plotnikov V, MD  LORazepam (ATIVAN) 2 MG tablet TAKE 1/2 TO 1 TABLET BY MOUTH TWICE DAILY AS NEEDED Patient taking differently: Take 1-2 mg by mouth 2 (two) times daily as needed for anxiety.  10/08/15  Yes Aleksei Plotnikov V, MD  Cariprazine HCl (VRAYLAR) 1.5 MG CAPS Take 1 capsule (1.5 mg total) by mouth daily. Patient not taking: Reported on 04/01/2016 03/23/16   Tresa Garter, MD  cholecalciferol (VITAMIN D) 1000 UNITS tablet Take 1 tablet (1,000 Units total) by mouth daily. Patient not taking: Reported on 05/29/2016 06/21/13   Tresa Garter, MD  cyanocobalamin 1000 MCG tablet Take 0.5 tablets (500 mcg total) by mouth daily. Patient not taking: Reported on 05/29/2016 06/21/13   Jacinta Shoe V, MD  ergocalciferol (VITAMIN D2) 50000 units capsule Take 1 capsule (50,000 Units total) by mouth once a week. Patient not taking: Reported on 05/29/2016 03/23/16   Jacinta Shoe V, MD  ibuprofen (ADVIL,MOTRIN) 800 MG tablet Take 1 tablet (800 mg total) by mouth every 8 (eight) hours as needed. Patient not taking: Reported on 04/01/2016 06/21/13   Tresa Garter, MD  meclizine (ANTIVERT) 12.5 MG tablet Take 1 tablet (12.5 mg total) by mouth 3 (three) times daily as needed for dizziness or nausea. Patient not  taking: Reported on 05/29/2016 10/08/15 10/07/16  Macarthur Critchley Plotnikov V, MD  omeprazole (PRILOSEC) 40 MG capsule Take 1 capsule (40 mg total) by mouth daily. Patient not taking: Reported on 05/29/2016 03/23/16   Macarthur Critchley Plotnikov V, MD  thiamine (VITAMIN B-1) 100 MG tablet Take 1 tablet (100 mg total) by mouth daily. Patient not taking: Reported on 05/29/2016 10/31/15   Aleksei Plotnikov V, MD  tretinoin (RETIN-A) 0.025 % cream APPLY TOPICALLY AT BEDTIME. ON FACE Patient not taking:  Reported on 05/29/2016 06/28/14   Tresa Garter, MD    Family History Family History  Problem Relation Age of Onset  . Heart disease Mother     pacemaker  . Hypertension Other     Social History Social History  Substance Use Topics  . Smoking status: Never Smoker  . Smokeless tobacco: Never Used  . Alcohol use No     Allergies   Patient has no known allergies.   Review of Systems Review of Systems  Constitutional: Negative for chills and fever.  HENT: Negative for facial swelling.   Eyes: Negative for visual disturbance.  Respiratory: Negative for cough and shortness of breath.   Cardiovascular: Negative for chest pain.  Gastrointestinal: Negative for abdominal pain, nausea and vomiting.  Genitourinary: Negative for flank pain.  Musculoskeletal: Negative for arthralgias, back pain, joint swelling, myalgias and neck pain.  Skin: Positive for wound.  Neurological: Positive for dizziness and headaches. Negative for facial asymmetry, speech difficulty, weakness, light-headedness and numbness.  Psychiatric/Behavioral: Negative for agitation, behavioral problems and confusion.     Physical Exam Updated Vital Signs BP 107/76 (BP Location: Left Arm)   Pulse 60   Temp 98.1 F (36.7 C) (Oral)   Resp 16   Ht 5\' 6"  (1.676 m)   Wt 81.6 kg   LMP 05/08/2014   SpO2 98%   BMI 29.05 kg/m   Physical Exam  Constitutional: She is oriented to person, place, and time. She appears well-developed and well-nourished. No distress.  HENT:  Head: Normocephalic.  Right Ear: External ear normal.  Left Ear: External ear normal.  Nose: Nose normal.  Mouth/Throat: Oropharynx is clear and moist. No oropharyngeal exudate.  1 cm wound to the R upper forehead, with underlying disruption of the galea. Hemostatic. Full ROM of the frontalis muscle, with pt able to raise both eyebrows equally and furrow her brows.   Eyes: Conjunctivae and EOM are normal. Pupils are equal, round, and reactive  to light. Right eye exhibits no discharge. Left eye exhibits no discharge.  Neck: Normal range of motion. Neck supple.  No midline TTP over the cervical spine  Cardiovascular: Normal rate, regular rhythm, normal heart sounds and intact distal pulses.  Exam reveals no gallop and no friction rub.   No murmur heard. Pulmonary/Chest: Effort normal and breath sounds normal. No respiratory distress. She has no wheezes. She has no rales. She exhibits no tenderness.  Abdominal: Soft. Bowel sounds are normal. She exhibits no distension. There is no tenderness. There is no guarding.  Musculoskeletal: Normal range of motion.  Extremities atraumatic. No midline TTP over the T or L spine. Pelvis stable.   Neurological: She is alert and oriented to person, place, and time. She exhibits normal muscle tone.  Face symmetric, tongue midline. 5/5 strength in the proximal and distal upper and lower extremities bilaterally, with intact sensation to light touch. Normal finger to nose, heel to shin, and rapid alternating movements. Normal speech.    Skin: Skin is warm and  dry. She is not diaphoretic.  Psychiatric: She has a normal mood and affect.     ED Treatments / Results  Labs (all labs ordered are listed, but only abnormal results are displayed) Labs Reviewed - No data to display  EKG  EKG Interpretation None       Radiology Ct Head Wo Contrast  Result Date: 05/29/2016 CLINICAL DATA:  Hit head, frontal headache EXAM: CT HEAD WITHOUT CONTRAST TECHNIQUE: Contiguous axial images were obtained from the base of the skull through the vertex without intravenous contrast. COMPARISON:  None. FINDINGS: Brain: No evidence of acute infarction, hemorrhage, hydrocephalus, extra-axial collection or mass lesion/mass effect. Vascular: No hyperdense vessel or unexpected calcification. Minimal carotid artery calcification. Skull: No fracture. Three circumscribed mildly lucent lesions in the posterior calvarium with mild  ground-glass density. Circumscribed lucent lesion in the left parietal bone may represent venous [place]. Sinuses/Orbits: Mucosal thickening in the ethmoid sinuses. No acute orbital abnormality. Other: None IMPRESSION: 1. No CT evidence for acute intracranial abnormality. 2. Nonspecific nonaggressive appearing posterior calvarial lesions. Could consider correlation with bone scan. Electronically Signed   By: Jasmine PangKim  Fujinaga M.D.   On: 05/29/2016 22:09    Procedures .Marland Kitchen.Laceration Repair Date/Time: 05/30/2016 1:13 AM Performed by: Charlie PitterIRICK, Amariyon Maynes BRUNNO Authorized by: Nira ConnARDAMA, PEDRO EDUARDO   Consent:    Consent obtained:  Verbal   Consent given by:  Patient   Risks discussed:  Infection, need for additional repair and poor cosmetic result Anesthesia (see MAR for exact dosages):    Anesthesia method:  Local infiltration   Local anesthetic:  Lidocaine 1% w/o epi Laceration details:    Location:  Face   Face location:  Forehead   Length (cm):  1   Laceration depth: disruption of galea. Repair type:    Repair type:  Intermediate Pre-procedure details:    Preparation:  Patient was prepped and draped in usual sterile fashion and imaging obtained to evaluate for foreign bodies Exploration:    Wound exploration: wound explored through full range of motion and entire depth of wound probed and visualized     Wound extent: no underlying fracture noted     Contaminated: no   Treatment:    Amount of cleaning:  Standard   Irrigation solution:  Sterile water   Irrigation volume:  100 cc   Irrigation method:  Pressure wash Subcutaneous repair:    Suture size:  5-0   Suture material:  Vicryl   Number of sutures:  1 Skin repair:    Repair method:  Sutures   Suture size:  4-0   Wound skin closure material used: vicryl.   Suture technique:  Vertical mattress   Number of sutures:  2 Approximation:    Approximation:  Close   Vermilion border: well-aligned   Post-procedure details:    Dressing:   Antibiotic ointment and adhesive bandage   Patient tolerance of procedure:  Tolerated well, no immediate complications   (including critical care time)  Medications Ordered in ED Medications  acetaminophen (TYLENOL) tablet 650 mg (650 mg Oral Given 05/29/16 1942)  ondansetron (ZOFRAN-ODT) disintegrating tablet 4 mg (4 mg Oral Given 05/29/16 1942)  lidocaine (PF) (XYLOCAINE) 1 % injection 5 mL (5 mLs Infiltration Given 05/29/16 2244)  oxyCODONE-acetaminophen (PERCOCET/ROXICET) 5-325 MG per tablet 1 tablet (1 tablet Oral Given 05/29/16 2243)     Initial Impression / Assessment and Plan / ED Course  I have reviewed the triage vital signs and the nursing notes.  Pertinent labs & imaging results that  were available during my care of the patient were reviewed by me and considered in my medical decision making (see chart for details).     Afebrile and hemodynamically stable. Neuro exam unremarkable as above. CT head with no acute intracranial abnormalities or skull fractures. Patient does have nonspecific nonaggressive appearing posterior calvarial lesions that were incidentally found on CT. This was discussed with the patient and her family, as well as discussion of the differential diagnosis to include malignancy. This was explicitly written in the patient's discharge instructions for her to bring to her primary care doctor, with recommendation for bone scan.  Layered closure of the patient's wound performed according to the procedure note above with closure of the galea. Wound well approximated with vertical mattress sutures. As this is an area of high tension, patient counseled that there will likely be scarring. Recommendations for wound care discussed with the patient and her family.  Patient has some intermittent dizziness and headache consistent with concussion. Concussion precautions discussed as well as strict return precautions. She can follow-up with her primary care physician on Monday.  She has family who will be staying with her tonight. She was discharged in stable condition.  Care of patient overseen by my attending, Dr. Eudelia Bunch.   Final Clinical Impressions(s) / ED Diagnoses   Final diagnoses:  Concussion with loss of consciousness of 30 minutes or less, initial encounter  Facial laceration, initial encounter    New Prescriptions New Prescriptions   No medications on file     Charlie Pitter, MD 05/30/16 0120

## 2016-05-30 NOTE — Discharge Instructions (Signed)
Please follow-up with your primary care physician and let them know that the CT scan of your head today showed some lesions in the posterior calvarium of your skull. You will require a follow-up bone scan for further evaluation.

## 2016-06-01 ENCOUNTER — Telehealth: Payer: Self-pay | Admitting: Internal Medicine

## 2016-06-01 NOTE — Telephone Encounter (Signed)
Checking to see how pt needs to follow up after concussion. (Concussion with loss of consciousness of 30 minutes or less, Facial laceration) Was told to follow up with PCP but Dr. Macario GoldsPlot does not have an opening until Next Friday. Please advise.
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
## 2016-06-01 NOTE — Telephone Encounter (Signed)
Called patient to try to set her up with an appointment on 3/27. Voicemail was not set up. Ok to book at 12:30pm.

## 2016-06-02 ENCOUNTER — Encounter: Payer: Self-pay | Admitting: Family Medicine

## 2016-06-02 ENCOUNTER — Ambulatory Visit (INDEPENDENT_AMBULATORY_CARE_PROVIDER_SITE_OTHER): Payer: Federal, State, Local not specified - PPO | Admitting: Family Medicine

## 2016-06-02 DIAGNOSIS — S060X9A Concussion with loss of consciousness of unspecified duration, initial encounter: Secondary | ICD-10-CM | POA: Diagnosis not present

## 2016-06-02 DIAGNOSIS — S060XAA Concussion with loss of consciousness status unknown, initial encounter: Secondary | ICD-10-CM | POA: Insufficient documentation

## 2016-06-02 NOTE — Assessment & Plan Note (Signed)
Patient did have a concussion. Seems to be mild. Impact score does show some difficulty still with visual changes but patient does not need new glasses. Discussed with Sr. that patient may have a alcohol problem and given information for this as well. Patient will try over-the-counter medications I think will be beneficial. We discussed icing regimen. We discussed home exercises. We discussed which activities to avoid at this time. I do believe the patient will do well but I would like patient come back and see me again in 2-4 weeks to make sure patient is responding to conservative therapy.

## 2016-06-02 NOTE — Patient Instructions (Signed)
Good to see you I do think you have a concussion.  In addition to this I recommend......  To help improve COGNITIVE function: Using fish oil/omega 3 that is 1000 mg (or roughly 600 mg EPA/DHA), starting as soon as possible after concussion, take: 3 tabs THREE TIMES a day  for the first 3 days, then (you will smell a little, sory) 3 tabs TWICE DAILY  for the next 3 days, then 3 tabs ONCE DAILY  for the next 10 days   To help reduce HEADACHES: Coenzyme Q10 160mg  ONCE DAILY May stop after headaches are resolved.                                                                                             To help with INSOMNIA: Tart cherry extract any dose at night      Other medicines to help decrease inflammation Alpha Lipoic Acid 100mg  TWICE DAILY Turmeric 500mg  daily  I want to see you again in 2-4 weeks.   [facility]    [address] [place], [place] [address]   Phone: [phone]

## 2016-06-24 ENCOUNTER — Ambulatory Visit: Payer: Federal, State, Local not specified - PPO | Admitting: Family Medicine

## 2016-10-15 ENCOUNTER — Other Ambulatory Visit: Payer: Self-pay | Admitting: Internal Medicine

## 2016-10-16 NOTE — Telephone Encounter (Signed)
Please advise about refill

## 2016-10-29 NOTE — Telephone Encounter (Signed)
Called in.

## 2016-11-03 ENCOUNTER — Encounter: Payer: Self-pay | Admitting: Internal Medicine

## 2016-11-03 ENCOUNTER — Ambulatory Visit (INDEPENDENT_AMBULATORY_CARE_PROVIDER_SITE_OTHER): Payer: Federal, State, Local not specified - PPO | Admitting: Internal Medicine

## 2016-11-03 ENCOUNTER — Other Ambulatory Visit (INDEPENDENT_AMBULATORY_CARE_PROVIDER_SITE_OTHER): Payer: Federal, State, Local not specified - PPO

## 2016-11-03 DIAGNOSIS — E038 Other specified hypothyroidism: Secondary | ICD-10-CM | POA: Diagnosis not present

## 2016-11-03 DIAGNOSIS — E559 Vitamin D deficiency, unspecified: Secondary | ICD-10-CM | POA: Diagnosis not present

## 2016-11-03 DIAGNOSIS — S060X1A Concussion with loss of consciousness of 30 minutes or less, initial encounter: Secondary | ICD-10-CM

## 2016-11-03 DIAGNOSIS — F411 Generalized anxiety disorder: Secondary | ICD-10-CM | POA: Diagnosis not present

## 2016-11-03 DIAGNOSIS — E538 Deficiency of other specified B group vitamins: Secondary | ICD-10-CM

## 2016-11-03 DIAGNOSIS — R930 Abnormal findings on diagnostic imaging of skull and head, not elsewhere classified: Secondary | ICD-10-CM | POA: Insufficient documentation

## 2016-11-03 DIAGNOSIS — R9089 Other abnormal findings on diagnostic imaging of central nervous system: Secondary | ICD-10-CM

## 2016-11-03 LAB — LIPID PANEL
CHOLESTEROL: 138 mg/dL (ref 0–200)
HDL: 62.6 mg/dL (ref >39.00)
LDL CALC: 64 mg/dL (ref 0–99)
NonHDL: 74.98
TRIGLYCERIDES: 55 mg/dL (ref 0.0–149.0)
Total CHOL/HDL Ratio: 2
VLDL: 11 mg/dL (ref 0.0–40.0)

## 2016-11-03 LAB — BASIC METABOLIC PANEL
BUN: 10 mg/dL (ref 6–23)
CHLORIDE: 103 meq/L (ref 96–112)
CO2: 31 mEq/L (ref 19–32)
CREATININE: 0.74 mg/dL (ref 0.40–1.20)
Calcium: 9.9 mg/dL (ref 8.4–10.5)
GFR: 103.88 mL/min (ref >60.00)
GLUCOSE: 85 mg/dL (ref 70–99)
POTASSIUM: 3.9 meq/L (ref 3.5–5.1)
Sodium: 137 mEq/L (ref 135–145)

## 2016-11-03 LAB — CBC WITH DIFFERENTIAL/PLATELET
BASOS PCT: 1.1 % (ref 0.0–3.0)
Basophils Absolute: 0.1 10*3/uL (ref 0.0–0.1)
EOS ABS: 0.3 10*3/uL (ref 0.0–0.7)
EOS PCT: 5.7 % — AB (ref 0.0–5.0)
HCT: 39.2 % (ref 36.0–46.0)
Hemoglobin: 12.8 g/dL (ref 12.0–15.0)
LYMPHS ABS: 3.2 10*3/uL (ref 0.7–4.0)
Lymphocytes Relative: 52.6 % — ABNORMAL HIGH (ref 12.0–46.0)
MCHC: 32.6 g/dL (ref 30.0–36.0)
MCV: 91.4 fl (ref 78.0–100.0)
MONO ABS: 0.7 10*3/uL (ref 0.1–1.0)
Monocytes Relative: 10.7 % (ref 3.0–12.0)
NEUTROS PCT: 29.9 % — AB (ref 43.0–77.0)
Neutro Abs: 1.8 10*3/uL (ref 1.4–7.7)
Platelets: 415 10*3/uL — ABNORMAL HIGH (ref 150.0–400.0)
RBC: 4.29 Mil/uL (ref 3.87–5.11)
RDW: 14.3 % (ref 11.5–15.5)
WBC: 6.1 10*3/uL (ref 4.0–10.5)

## 2016-11-03 LAB — HEPATIC FUNCTION PANEL
ALT: 35 U/L (ref 0–35)
AST: 46 U/L — ABNORMAL HIGH (ref 0–37)
Albumin: 3.5 g/dL (ref 3.5–5.2)
Alkaline Phosphatase: 310 U/L — ABNORMAL HIGH (ref 39–117)
BILIRUBIN DIRECT: 0.3 mg/dL (ref 0.0–0.3)
Total Bilirubin: 0.4 mg/dL (ref 0.2–1.2)
Total Protein: 8.6 g/dL — ABNORMAL HIGH (ref 6.0–8.3)

## 2016-11-03 LAB — TSH: TSH: 2.67 u[IU]/mL (ref 0.35–4.50)

## 2016-11-03 MED ORDER — CYANOCOBALAMIN 1000 MCG/ML IJ SOLN
1000.0000 ug | Freq: Once | INTRAMUSCULAR | Status: AC
  Administered 2016-11-03: 1000 ug via INTRAMUSCULAR

## 2016-11-03 NOTE — Assessment & Plan Note (Signed)
On Vit D 

## 2016-11-03 NOTE — Assessment & Plan Note (Signed)
MRI ordered again

## 2016-11-03 NOTE — Addendum Note (Signed)
Addended by: Scarlett Presto on: 11/03/2016 04:51 PM   Modules accepted: Orders

## 2016-11-03 NOTE — Assessment & Plan Note (Signed)
Doing well 

## 2016-11-03 NOTE — Progress Notes (Signed)
Subjective:  Patient ID: Margaret Preston, female    DOB: 09/23/1960  Age: 56 y.o. MRN: [id number]  CC: No chief complaint on file.   HPI Margaret Preston presents for concussion in March 2018, anxiety,  Depression,   Outpatient Medications Prior to Visit  Medication Sig Dispense Refill  . cholecalciferol (VITAMIN D) 1000 UNITS tablet Take 1 tablet (1,000 Units total) by mouth daily. 100 tablet 3  . cyanocobalamin 1000 MCG tablet Take 0.5 tablets (500 mcg total) by mouth daily. 100 tablet 3  . ergocalciferol (VITAMIN D2) 50000 units capsule Take 1 capsule (50,000 Units total) by mouth once a week. 6 capsule 2  . FLUoxetine (PROZAC) 40 MG capsule Take 1 capsule (40 mg total) by mouth daily. 90 capsule 1  . ibuprofen (ADVIL,MOTRIN) 800 MG tablet Take 1 tablet (800 mg total) by mouth every 8 (eight) hours as needed. 180 tablet 1  . LORazepam (ATIVAN) 2 MG tablet TAKE 1/2 TO 1 TABLET BY MOUTH TWICE A DAY AS NEEDED 180 tablet 0  . omeprazole (PRILOSEC) 40 MG capsule Take 1 capsule (40 mg total) by mouth daily. 30 capsule 11  . thiamine (VITAMIN B-1) 100 MG tablet Take 1 tablet (100 mg total) by mouth daily. 100 tablet 1  . tretinoin (RETIN-A) 0.025 % cream APPLY TOPICALLY AT BEDTIME. ON FACE 45 g 0  . Cariprazine HCl (VRAYLAR) 1.5 MG CAPS Take 1 capsule (1.5 mg total) by mouth daily. 30 capsule 5   No facility-administered medications prior to visit.     ROS Review of Systems  Constitutional: Negative for activity change, appetite change, chills, fatigue and unexpected weight change.  HENT: Negative for congestion, mouth sores and sinus pressure.   Eyes: Negative for visual disturbance.  Respiratory: Negative for cough and chest tightness.   Gastrointestinal: Negative for abdominal pain and nausea.  Genitourinary: Negative for difficulty urinating, frequency and vaginal pain.  Musculoskeletal: Negative for back pain and gait problem.  Skin: Negative for pallor and rash.    Neurological: Negative for dizziness, tremors, weakness, numbness and headaches.  Psychiatric/Behavioral: Negative for confusion and sleep disturbance.    Objective:  BP 112/68 (BP Location: Left Arm, Patient Position: Sitting, Cuff Size: Large)   Pulse 73   Temp 98.5 F (36.9 C) (Oral)   Ht 5\' 6"  (1.676 m)   Wt 191 lb (86.6 kg)   LMP 05/08/2014   SpO2 98%   BMI 30.83 kg/m   BP Readings from Last 3 Encounters:  11/03/16 112/68  06/02/16 110/74  05/30/16 107/76    Wt Readings from Last 3 Encounters:  11/03/16 191 lb (86.6 kg)  06/02/16 180 lb (81.6 kg)  05/29/16 180 lb (81.6 kg)    Physical Exam  Constitutional: She appears well-developed. No distress.  HENT:  Head: Normocephalic.  Right Ear: External ear normal.  Left Ear: External ear normal.  Nose: Nose normal.  Mouth/Throat: Oropharynx is clear and moist.  Eyes: Pupils are equal, round, and reactive to light. Conjunctivae are normal. Right eye exhibits no discharge. Left eye exhibits no discharge.  Neck: Normal range of motion. Neck supple. No JVD present. No tracheal deviation present. No thyromegaly present.  Cardiovascular: Normal rate, regular rhythm and normal heart sounds.   Pulmonary/Chest: No stridor. No respiratory distress. She has no wheezes.  Abdominal: Soft. Bowel sounds are normal. She exhibits no distension and no mass. There is no tenderness. There is no rebound and no guarding.  Musculoskeletal: She exhibits no edema or tenderness.  Lymphadenopathy:  She has no cervical adenopathy.  Neurological: She displays normal reflexes. No cranial nerve deficit. She exhibits normal muscle tone. Coordination normal.  Skin: No rash noted. No erythema.  Psychiatric: She has a normal mood and affect. Her behavior is normal. Judgment and thought content normal.    Lab Results  Component Value Date   WBC 6.5 10/08/2015   HGB 12.4 10/08/2015   HCT 38.4 10/08/2015   PLT 422.0 (H) 10/08/2015   GLUCOSE 85  10/08/2015   CHOL 127 06/19/2015   TRIG 66.0 06/19/2015   HDL 48.50 06/19/2015   LDLCALC 65 06/19/2015   ALT 17 06/19/2015   AST 26 06/19/2015   NA 138 10/08/2015   K 3.6 10/08/2015   CL 101 10/08/2015   CREATININE 0.76 10/08/2015   BUN 11 10/08/2015   CO2 30 10/08/2015   TSH 4.10 10/08/2015   HGBA1C 5.9 02/28/2013    Ct Head Wo Contrast  Result Date: 05/29/2016 CLINICAL DATA:  Hit head, frontal headache EXAM: CT HEAD WITHOUT CONTRAST TECHNIQUE: Contiguous axial images were obtained from the base of the skull through the vertex without intravenous contrast. COMPARISON:  None. FINDINGS: Brain: No evidence of acute infarction, hemorrhage, hydrocephalus, extra-axial collection or mass lesion/mass effect. Vascular: No hyperdense vessel or unexpected calcification. Minimal carotid artery calcification. Skull: No fracture. Three circumscribed mildly lucent lesions in the posterior calvarium with mild ground-glass density. Circumscribed lucent lesion in the left parietal bone may represent venous [place]. Sinuses/Orbits: Mucosal thickening in the ethmoid sinuses. No acute orbital abnormality. Other: None IMPRESSION: 1. No CT evidence for acute intracranial abnormality. 2. Nonspecific nonaggressive appearing posterior calvarial lesions. Could consider correlation with bone scan. Electronically Signed   By: Jasmine Pang M.D.   On: 05/29/2016 22:09    Assessment & Plan:   There are no diagnoses linked to this encounter. I have discontinued Margaret Preston's Cariprazine HCl. I am also having her maintain her cholecalciferol, cyanocobalamin, ibuprofen, tretinoin, thiamine, ergocalciferol, omeprazole, FLUoxetine, and LORazepam.  No orders of the defined types were placed in this encounter.    Follow-up: No Follow-up on file.  Sonda Primes, MD

## 2016-11-03 NOTE — Assessment & Plan Note (Signed)
Labs On B12 

## 2016-11-03 NOTE — Assessment & Plan Note (Signed)
Labs

## 2016-11-03 NOTE — Assessment & Plan Note (Signed)
Lorazepam prn 

## 2016-11-11 ENCOUNTER — Other Ambulatory Visit: Payer: Self-pay | Admitting: Internal Medicine

## 2016-11-11 DIAGNOSIS — R7989 Other specified abnormal findings of blood chemistry: Secondary | ICD-10-CM

## 2016-11-11 DIAGNOSIS — R945 Abnormal results of liver function studies: Principal | ICD-10-CM

## 2016-11-14 ENCOUNTER — Other Ambulatory Visit: Payer: Self-pay | Admitting: Internal Medicine

## 2016-11-23 ENCOUNTER — Ambulatory Visit
Admission: RE | Admit: 2016-11-23 | Discharge: 2016-11-23 | Disposition: A | Discharge status: Home / Self Care | Payer: Federal, State, Local not specified - PPO | Source: Ambulatory Visit | Attending: Internal Medicine | Admitting: Internal Medicine

## 2016-11-23 DIAGNOSIS — R7989 Other specified abnormal findings of blood chemistry: Secondary | ICD-10-CM

## 2016-11-23 DIAGNOSIS — R945 Abnormal results of liver function studies: Principal | ICD-10-CM

## 2016-11-23 DIAGNOSIS — S060X1A Concussion with loss of consciousness of 30 minutes or less, initial encounter: Secondary | ICD-10-CM

## 2016-11-23 DIAGNOSIS — S060X0A Concussion without loss of consciousness, initial encounter: Secondary | ICD-10-CM | POA: Diagnosis not present

## 2016-11-23 MED ORDER — GADOBENATE DIMEGLUMINE 529 MG/ML IV SOLN
16.0000 mL | Freq: Once | INTRAVENOUS | Status: AC | PRN
  Administered 2016-11-23: 16 mL via INTRAVENOUS

## 2016-12-03 ENCOUNTER — Encounter: Payer: Self-pay | Admitting: Family

## 2016-12-03 ENCOUNTER — Ambulatory Visit (INDEPENDENT_AMBULATORY_CARE_PROVIDER_SITE_OTHER): Payer: Federal, State, Local not specified - PPO | Admitting: Family

## 2016-12-03 ENCOUNTER — Other Ambulatory Visit (INDEPENDENT_AMBULATORY_CARE_PROVIDER_SITE_OTHER): Payer: Federal, State, Local not specified - PPO

## 2016-12-03 VITALS — BP 104/66 | HR 56 | Temp 98.0°F | Resp 16 | Ht 66.0 in | Wt 190.0 lb

## 2016-12-03 DIAGNOSIS — E538 Deficiency of other specified B group vitamins: Secondary | ICD-10-CM

## 2016-12-03 DIAGNOSIS — R350 Frequency of micturition: Secondary | ICD-10-CM | POA: Insufficient documentation

## 2016-12-03 DIAGNOSIS — S060X1A Concussion with loss of consciousness of 30 minutes or less, initial encounter: Secondary | ICD-10-CM

## 2016-12-03 LAB — URINALYSIS, ROUTINE W REFLEX MICROSCOPIC
BILIRUBIN URINE: NEGATIVE
HGB URINE DIPSTICK: NEGATIVE
KETONES UR: NEGATIVE
NITRITE: NEGATIVE
RBC / HPF: NONE SEEN (ref 0–?)
Specific Gravity, Urine: 1.02 (ref 1.000–1.030)
TOTAL PROTEIN, URINE-UPE24: NEGATIVE
UROBILINOGEN UA: 0.2 (ref 0.0–1.0)
Urine Glucose: NEGATIVE
pH: 6 (ref 5.0–8.0)

## 2016-12-03 MED ORDER — SULFAMETHOXAZOLE-TRIMETHOPRIM 800-160 MG PO TABS: 1.0000 | ORAL_TABLET | Freq: Two times a day (BID) | ORAL | 0 refills | Status: DC

## 2016-12-03 NOTE — Progress Notes (Signed)
Subjective:    Patient ID: Andree Coss, female    DOB: Jul 12, 1960, 56 y.o.   MRN: [id number]  Chief Complaint  Patient presents with  . Flank Pain    left sided flank pain that she states has been bothering her for a few weeks, urinary frequency, gave sample to the lab    HPI:  Chanah Tidmore is a 56 y.o. female who  has a past medical history of Depression; GERD (gastroesophageal reflux disease); Hypothyroidism (2009); Insomnia; Obesity; Pancreatitis (2011); Vitamin B12 deficiency (2009); and Vitamin D deficiency. and presents today for an acute office visit.   This is a new problem. Associated symptom of pain located in her right flank has been going on for a couple of weeks. Notes new onset urinary frequency with no fevers.  Modifying factors include drinking plenty of water.   No Known Allergies    Outpatient Medications Prior to Visit  Medication Sig Dispense Refill  . cholecalciferol (VITAMIN D) 1000 UNITS tablet Take 1 tablet (1,000 Units total) by mouth daily. 100 tablet 3  . cyanocobalamin 1000 MCG tablet Take 0.5 tablets (500 mcg total) by mouth daily. 100 tablet 3  . FLUoxetine (PROZAC) 40 MG capsule Take 1 capsule (40 mg total) by mouth daily. 90 capsule 1  . ibuprofen (ADVIL,MOTRIN) 800 MG tablet Take 1 tablet (800 mg total) by mouth every 8 (eight) hours as needed. 180 tablet 1  . LORazepam (ATIVAN) 2 MG tablet TAKE 1/2 TO 1 TABLET BY MOUTH TWICE A DAY AS NEEDED 180 tablet 0  . omeprazole (PRILOSEC) 40 MG capsule Take 1 capsule (40 mg total) by mouth daily. 30 capsule 11  . thiamine (VITAMIN B-1) 100 MG tablet Take 1 tablet (100 mg total) by mouth daily. 100 tablet 1  . tretinoin (RETIN-A) 0.025 % cream APPLY TOPICALLY AT BEDTIME. ON FACE 45 g 0  . Vitamin D, Ergocalciferol, (DRISDOL) 50000 units CAPS capsule TAKE 1 CAPSULE (50,000 UNITS TOTAL) BY MOUTH ONCE A WEEK. 6 capsule 2   No facility-administered medications prior to visit.     Past Medical  History:  Diagnosis Date  . Depression   . GERD (gastroesophageal reflux disease)   . Hypothyroidism 2009  . Insomnia   . Obesity   . Pancreatitis 2011  . Vitamin B12 deficiency 2009  . Vitamin D deficiency     Review of Systems  Constitutional: Negative for chills and fever.  Genitourinary: Positive for flank pain, frequency and urgency. Negative for dysuria and hematuria.      Objective:    BP 104/66 (BP Location: Left Arm, Patient Position: Sitting)   Pulse (!) 56   Temp 98 F (36.7 C) (Oral)   Resp 16   Ht  (1.676 m)   Wt 190 lb (86.2 kg)   LMP 05/08/2014   SpO2 98%   BMI 30.67 kg/m  Nursing note and vital signs reviewed.  Physical Exam  Constitutional: She is oriented to person, place, and time. She appears well-developed and well-nourished. No distress.  Cardiovascular: Normal rate, regular rhythm, normal heart sounds and intact distal pulses.   Pulmonary/Chest: Effort normal and breath sounds normal.  Abdominal: There is CVA tenderness.  Neurological: She is alert and oriented to person, place, and time.  Skin: Skin is warm and dry.  Psychiatric: She has a normal mood and affect. Her behavior is normal. Judgment and thought content normal.       Assessment & Plan:   Problem List Items Addressed This  Visit      Other   Urinary frequency - Primary    Symptoms of urinary frequency and urgency concerning for urinary tract infection. UA unavailable at time of office visit. Obtain urine culture. Start Bactrim. Encouraged to drink plenty of fluids. Follow-up if symptoms worsen or do not improve.      Relevant Orders   Urine Culture       I am having Ms. Botz start on sulfamethoxazole-trimethoprim. I am also having her maintain her cholecalciferol, cyanocobalamin, ibuprofen, tretinoin, thiamine, omeprazole, FLUoxetine, LORazepam, and Vitamin D (Ergocalciferol).   Meds ordered this encounter  Medications  . sulfamethoxazole-trimethoprim (BACTRIM  DS) 800-160 MG tablet    Sig: Take 1 tablet by mouth 2 (two) times daily.    Dispense:  6 tablet    Refill:  0    Order Specific Question:   Supervising Provider    Answer:   Hillard Danker A [[id number]]     Follow-up: Return if symptoms worsen or fail to improve.  Jeanine Luz, FNP

## 2016-12-03 NOTE — Assessment & Plan Note (Signed)
Symptoms of urinary frequency and urgency concerning for urinary tract infection. UA unavailable at time of office visit. Obtain urine culture. Start Bactrim. Encouraged to drink plenty of fluids. Follow-up if symptoms worsen or do not improve.

## 2016-12-03 NOTE — Patient Instructions (Signed)
Thank you for choosing [facility].  SUMMARY AND INSTRUCTIONS:  Please start bactrim.   We will check your urine culture.   Drink plenty of water.   Medication:  Your prescription(s) have been submitted to your pharmacy or been printed and provided for you. Please take as directed and contact our office if you believe you are having problem(s) with the medication(s) or have any questions.  Follow up:  If your symptoms worsen or fail to improve, please contact our office for further instruction, or in case of emergency go directly to the emergency room at the closest medical facility.

## 2016-12-07 ENCOUNTER — Telehealth: Payer: Self-pay | Admitting: Internal Medicine

## 2016-12-07 DIAGNOSIS — N3090 Cystitis, unspecified without hematuria: Secondary | ICD-10-CM

## 2016-12-07 MED ORDER — FLUCONAZOLE 150 MG PO TABS: 150.0000 mg | ORAL_TABLET | Freq: Every day | ORAL | 0 refills | Status: DC

## 2016-12-07 NOTE — Telephone Encounter (Signed)
Patient was seen by Tammy Sours last week for a UTI.  She was given six pills of an antibiotic  to get her through until the culture came back.  She still has symptoms and is out of meds.  She also needs something for yeast infection.  Patient uses [facility] on [address].  Please follow up with patient in regard.

## 2016-12-07 NOTE — Telephone Encounter (Signed)
Notified pt w/MD response. Sent Diflucan to [facility].../lmb

## 2016-12-07 NOTE — Telephone Encounter (Signed)
Repeat UA and Cx OK Diflucan 150 mg 1 tab once Thx

## 2016-12-08 ENCOUNTER — Telehealth: Payer: Self-pay | Admitting: Internal Medicine

## 2016-12-08 ENCOUNTER — Other Ambulatory Visit (INDEPENDENT_AMBULATORY_CARE_PROVIDER_SITE_OTHER): Payer: Federal, State, Local not specified - PPO

## 2016-12-08 DIAGNOSIS — N3090 Cystitis, unspecified without hematuria: Secondary | ICD-10-CM | POA: Diagnosis not present

## 2016-12-08 LAB — URINALYSIS, ROUTINE W REFLEX MICROSCOPIC
Bilirubin Urine: NEGATIVE
HGB URINE DIPSTICK: NEGATIVE
KETONES UR: NEGATIVE
Leukocytes, UA: NEGATIVE
Nitrite: NEGATIVE
PH: 7 (ref 5.0–8.0)
RBC / HPF: NONE SEEN (ref 0–?)
SPECIFIC GRAVITY, URINE: 1.015 (ref 1.000–1.030)
Total Protein, Urine: NEGATIVE
URINE GLUCOSE: NEGATIVE
Urobilinogen, UA: 0.2 (ref 0.0–1.0)
WBC UA: NONE SEEN (ref 0–?)

## 2016-12-08 NOTE — Telephone Encounter (Signed)
Pt called checking on the results from her urinalysis that was done this morning. Please advise.

## 2016-12-09 NOTE — Telephone Encounter (Signed)
Pt is calling back about this. She asked that you give her a call. Thanks.

## 2016-12-09 NOTE — Telephone Encounter (Signed)
Per dr Posey Rea, urinalysis results are ok, no UTI---patient advised, patient has not picked up diflucan yet, she will go get that med and take it ---advised to call back if this does not work---per dr Posey Rea, she will need office visit if not feeling better

## 2017-01-12 ENCOUNTER — Encounter: Payer: Self-pay | Admitting: Internal Medicine

## 2017-01-12 ENCOUNTER — Ambulatory Visit: Payer: Federal, State, Local not specified - PPO | Admitting: Internal Medicine

## 2017-01-12 DIAGNOSIS — R748 Abnormal levels of other serum enzymes: Secondary | ICD-10-CM | POA: Diagnosis not present

## 2017-01-12 DIAGNOSIS — R930 Abnormal findings on diagnostic imaging of skull and head, not elsewhere classified: Secondary | ICD-10-CM | POA: Diagnosis not present

## 2017-01-12 DIAGNOSIS — R51 Headache: Secondary | ICD-10-CM

## 2017-01-12 DIAGNOSIS — R270 Ataxia, unspecified: Secondary | ICD-10-CM

## 2017-01-12 DIAGNOSIS — E538 Deficiency of other specified B group vitamins: Secondary | ICD-10-CM

## 2017-01-12 DIAGNOSIS — E038 Other specified hypothyroidism: Secondary | ICD-10-CM | POA: Diagnosis not present

## 2017-01-12 DIAGNOSIS — R519 Headache, unspecified: Secondary | ICD-10-CM

## 2017-01-12 NOTE — Assessment & Plan Note (Signed)
Resolved

## 2017-01-12 NOTE — Progress Notes (Signed)
Subjective:  Patient ID: Margaret Preston, female    DOB: 03/11/1960  Age: 56 y.o. MRN: [id number]  CC: No chief complaint on file.   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 cholecalciferol (VITAMIN D) 1000 UNITS tablet Take 1 tablet (1,000 Units total) by mouth daily. 100 tablet 3  . cyanocobalamin 1000 MCG tablet Take 0.5 tablets (500 mcg total) by mouth daily. 100 tablet 3  . fluconazole (DIFLUCAN) 150 MG tablet Take 1 tablet (150 mg total) by mouth daily. 1 tablet 0  . FLUoxetine (PROZAC) 40 MG capsule Take 1 capsule (40 mg total) by mouth daily. 90 capsule 1  . ibuprofen (ADVIL,MOTRIN) 800 MG tablet Take 1 tablet (800 mg total) by mouth every 8 (eight) hours as needed. 180 tablet 1  . LORazepam (ATIVAN) 2 MG tablet TAKE 1/2 TO 1 TABLET BY MOUTH TWICE A DAY AS NEEDED 180 tablet 0  . omeprazole (PRILOSEC) 40 MG capsule Take 1 capsule (40 mg total) by mouth daily. 30 capsule 11  . sulfamethoxazole-trimethoprim (BACTRIM DS) 800-160 MG tablet Take 1 tablet by mouth 2 (two) times daily. 6 tablet 0  . thiamine (VITAMIN B-1) 100 MG tablet Take 1 tablet (100 mg total) by mouth daily. 100 tablet 1  . tretinoin (RETIN-A) 0.025 % cream APPLY TOPICALLY AT BEDTIME. ON FACE 45 g 0  . Vitamin D, Ergocalciferol, (DRISDOL) 50000 units CAPS capsule TAKE 1 CAPSULE (50,000 UNITS TOTAL) BY MOUTH ONCE A WEEK. 6 capsule 2   No facility-administered medications prior to visit.     ROS Review of Systems  Constitutional: Negative for activity change, appetite change, chills, fatigue and unexpected weight change.  HENT: Negative for congestion, mouth sores and sinus pressure.   Eyes: Negative for visual disturbance.  Respiratory: Negative for cough and chest tightness.   Gastrointestinal: Negative for abdominal pain and nausea.    Genitourinary: Negative for difficulty urinating, frequency and vaginal pain.  Musculoskeletal: Negative for back pain and gait problem.  Skin: Negative for pallor and rash.  Neurological: Negative for dizziness, tremors, weakness, numbness and headaches.  Psychiatric/Behavioral: Negative for confusion, sleep disturbance and suicidal ideas.    Objective:  BP 104/62 (BP Location: Right Arm, Patient Position: Sitting, Cuff Size: Large)   Pulse 66   Temp 98.1 F (36.7 C) (Oral)   Ht 5\' 6"  (1.676 m)   Wt 188 lb (85.3 kg)   LMP 05/08/2014   SpO2 100%   BMI 30.34 kg/m   BP Readings from Last 3 Encounters:  01/12/17 104/62  12/03/16 104/66  11/03/16 112/68    Wt Readings from Last 3 Encounters:  01/12/17 188 lb (85.3 kg)  12/03/16 190 lb (86.2 kg)  11/03/16 191 lb (86.6 kg)    Physical Exam  Constitutional: She appears well-developed. No distress.  HENT:  Head: Normocephalic.  Right Ear: External ear normal.  Left Ear: External ear normal.  Nose: Nose normal.  Mouth/Throat: Oropharynx is clear and moist.  Eyes: Conjunctivae are normal. Pupils are equal, round, and reactive to light. Right eye exhibits no discharge. Left eye exhibits no discharge.  Neck: Normal range of motion. Neck supple. No JVD present. No tracheal deviation present. No thyromegaly present.  Cardiovascular: Normal rate, regular rhythm and normal heart sounds.  Pulmonary/Chest: No stridor. No respiratory distress. She has no wheezes.  Abdominal: Soft. Bowel sounds are  normal. She exhibits no distension and no mass. There is no tenderness. There is no rebound and no guarding.  Musculoskeletal: She exhibits no edema or tenderness.  Lymphadenopathy:    She has no cervical adenopathy.  Neurological: She displays normal reflexes. No cranial nerve deficit. She exhibits normal muscle tone. Coordination normal.  Skin: No rash noted. No erythema.  Psychiatric: She has a normal mood and affect. Her behavior is  normal. Judgment and thought content normal.  obese  Lab Results  Component Value Date   WBC 6.1 11/03/2016   HGB 12.8 11/03/2016   HCT 39.2 11/03/2016   PLT 415.0 (H) 11/03/2016   GLUCOSE 85 11/03/2016   CHOL 138 11/03/2016   TRIG 55.0 11/03/2016   HDL 62.60 11/03/2016   LDLCALC 64 11/03/2016   ALT 35 11/03/2016   AST 46 (H) 11/03/2016   NA 137 11/03/2016   K 3.9 11/03/2016   CL 103 11/03/2016   CREATININE 0.74 11/03/2016   BUN 10 11/03/2016   CO2 31 11/03/2016   TSH 2.67 11/03/2016   HGBA1C 5.9 02/28/2013    Mr Brain W Wo Contrast  Result Date: 11/23/2016 CLINICAL DATA:  History of concussion.  Follow-up abnormal CT EXAM: MRI HEAD WITHOUT AND WITH CONTRAST TECHNIQUE: Multiplanar, multiecho pulse sequences of the brain and surrounding structures were obtained without and with intravenous contrast. CONTRAST:  16mL MULTIHANCE GADOBENATE DIMEGLUMINE 529 MG/ML IV SOLN COMPARISON:  CT head 05/29/2016 FINDINGS: Brain: Image quality degraded by motion Negative for infarct, mass, or hemorrhage. Ventricle size normal. Normal enhancement of the brain Vascular: Normal arterial flow void.  Normal venous enhancement Skull and upper cervical spine: Multiple enhancing lesions in the calvarium as noted on CT. These are unchanged. These are located within the diploic space with 2 lesions present in the right parietal bone, and 2 lesions in the left parietal bone. No extraosseous soft tissue mass. The posterior lesions show ground-glass density on CT suggestive of fibrous dysplasia which can enhance. Sinuses/Orbits: Negative Other: None IMPRESSION: Image quality degraded by motion. 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 Patient has no known history of cancer making metastatic disease unlikely given the stability and appearance. Electronically Signed   By: Marlan Palauharles  Clark M.D.   On: 11/23/2016  14:56   [place] Abdomen Limited Ruq  Result Date: 11/23/2016 CLINICAL DATA:  Elevated LFTs EXAM: ULTRASOUND ABDOMEN LIMITED RIGHT UPPER QUADRANT COMPARISON:  None. FINDINGS: Gallbladder: Prior cholecystectomy. Common bile duct: Diameter: 4.8 mm Liver: No focal hepatic mass. Increased hepatic echogenicity as can be seen with hepatic steatosis. Portal vein is patent on color Doppler imaging with normal direction of blood flow towards the liver. IMPRESSION: 1. Increased hepatic echogenicity as can be seen with hepatic steatosis. Electronically Signed   By: Elige KoHetal  Patel   On: 11/23/2016 09:22    Assessment & Plan:   There are no diagnoses linked to this encounter. I am having Lamonica Hector maintain her cholecalciferol, cyanocobalamin, ibuprofen, tretinoin, thiamine, omeprazole, FLUoxetine, LORazepam, Vitamin D (Ergocalciferol), sulfamethoxazole-trimethoprim, and fluconazole.  No orders of the defined types were placed in this encounter.    Follow-up: No Follow-up on file.  Sonda PrimesAlex Plotnikov, MD

## 2017-01-12 NOTE — Assessment & Plan Note (Signed)
Recheck.

## 2017-01-12 NOTE — Assessment & Plan Note (Signed)
On B12 

## 2017-01-12 NOTE — Assessment & Plan Note (Signed)
No relapse 

## 2017-01-12 NOTE — Assessment & Plan Note (Signed)
Repeat labs No ETOH Abd [place]

## 2017-01-12 NOTE — Addendum Note (Signed)
Addended by: Scarlett PrestoFRIEDENBACH, Malaijah Houchen on: 01/12/2017 10:53 AM   Modules accepted: Orders

## 2017-01-12 NOTE — Assessment & Plan Note (Addendum)
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

## 2017-02-21 ENCOUNTER — Other Ambulatory Visit: Payer: Self-pay | Admitting: Internal Medicine

## 2017-03-26 ENCOUNTER — Other Ambulatory Visit: Payer: Self-pay | Admitting: Internal Medicine

## 2017-03-30 ENCOUNTER — Encounter: Payer: Federal, State, Local not specified - PPO | Admitting: Internal Medicine

## 2017-04-06 ENCOUNTER — Ambulatory Visit (INDEPENDENT_AMBULATORY_CARE_PROVIDER_SITE_OTHER): Payer: Federal, State, Local not specified - PPO | Admitting: Internal Medicine

## 2017-04-06 ENCOUNTER — Encounter: Payer: Self-pay | Admitting: Internal Medicine

## 2017-04-06 VITALS — BP 106/62 | HR 77 | Temp 98.4°F | Ht 66.0 in | Wt 191.0 lb

## 2017-04-06 DIAGNOSIS — Z23 Encounter for immunization: Secondary | ICD-10-CM | POA: Diagnosis not present

## 2017-04-06 DIAGNOSIS — E038 Other specified hypothyroidism: Secondary | ICD-10-CM

## 2017-04-06 DIAGNOSIS — E559 Vitamin D deficiency, unspecified: Secondary | ICD-10-CM

## 2017-04-06 DIAGNOSIS — L709 Acne, unspecified: Secondary | ICD-10-CM | POA: Diagnosis not present

## 2017-04-06 DIAGNOSIS — Z0000 Encounter for general adult medical examination without abnormal findings: Secondary | ICD-10-CM | POA: Diagnosis not present

## 2017-04-06 DIAGNOSIS — E538 Deficiency of other specified B group vitamins: Secondary | ICD-10-CM

## 2017-04-06 DIAGNOSIS — F411 Generalized anxiety disorder: Secondary | ICD-10-CM | POA: Diagnosis not present

## 2017-04-06 DIAGNOSIS — F3341 Major depressive disorder, recurrent, in partial remission: Secondary | ICD-10-CM

## 2017-04-06 DIAGNOSIS — K219 Gastro-esophageal reflux disease without esophagitis: Secondary | ICD-10-CM

## 2017-04-06 MED ORDER — RANITIDINE HCL 150 MG PO TABS: 150.0000 mg | ORAL_TABLET | Freq: Two times a day (BID) | ORAL | 3 refills | Status: DC

## 2017-04-06 MED ORDER — TRETINOIN 0.025 % EX CREA: TOPICAL_CREAM | CUTANEOUS | 3 refills | Status: DC

## 2017-04-06 MED ORDER — CYANOCOBALAMIN 1000 MCG/ML IJ SOLN
1000.0000 ug | Freq: Once | INTRAMUSCULAR | Status: AC
Start: 2017-04-06 — End: 2017-04-06
  Administered 2017-04-06: 1000 ug via INTRAMUSCULAR

## 2017-04-06 NOTE — Progress Notes (Signed)
Subjective:  Patient ID: Margaret Preston, female    DOB: 07/11/1960  Age: 57 y.o. MRN: [id number]  CC: No chief complaint on file.   HPI Margaret Preston Preston for a well exam F/u depression F/u GERD - wants to d/c Prilosec  Outpatient Medications Prior to Visit  Medication Sig Dispense Refill  . cholecalciferol (VITAMIN D) 1000 UNITS tablet Take 1 tablet (1,000 Units total) by mouth daily. 100 tablet 3  . cyanocobalamin 1000 MCG tablet Take 0.5 tablets (500 mcg total) by mouth daily. 100 tablet 3  . fluconazole (DIFLUCAN) 150 MG tablet Take 1 tablet (150 mg total) by mouth daily. 1 tablet 0  . FLUoxetine (PROZAC) 40 MG capsule TAKE 1 CAPSULE (40 MG TOTAL) BY MOUTH DAILY. 90 capsule 1  . ibuprofen (ADVIL,MOTRIN) 800 MG tablet Take 1 tablet (800 mg total) by mouth every 8 (eight) hours as needed. 180 tablet 1  . LORazepam (ATIVAN) 2 MG tablet TAKE 1/2 TO 1 TABLET BY MOUTH TWICE A DAY AS NEEDED 180 tablet 0  . omeprazole (PRILOSEC) 40 MG capsule TAKE ONE CAPSULE BY MOUTH EVERY DAY 30 capsule 6  . sulfamethoxazole-trimethoprim (BACTRIM DS) 800-160 MG tablet Take 1 tablet by mouth 2 (two) times daily. 6 tablet 0  . thiamine (VITAMIN B-1) 100 MG tablet Take 1 tablet (100 mg total) by mouth daily. 100 tablet 1  . tretinoin (RETIN-A) 0.025 % cream APPLY TOPICALLY AT BEDTIME. ON FACE 45 g 0  . Vitamin D, Ergocalciferol, (DRISDOL) 50000 units CAPS capsule TAKE 1 CAPSULE (50,000 UNITS TOTAL) BY MOUTH ONCE A WEEK. 6 capsule 2   No facility-administered medications prior to visit.     ROS Review of Systems  Constitutional: Negative for activity change, appetite change, chills, fatigue and unexpected weight change.  HENT: Negative for congestion, mouth sores and sinus pressure.   Eyes: Negative for visual disturbance.  Respiratory: Negative for cough and chest tightness.   Gastrointestinal: Negative for abdominal pain and nausea.  Genitourinary: Negative for difficulty urinating,  frequency and vaginal pain.  Musculoskeletal: Negative for back pain and gait problem.  Skin: Negative for pallor and rash.  Neurological: Negative for dizziness, tremors, weakness, numbness and headaches.  Psychiatric/Behavioral: Negative for confusion, sleep disturbance and suicidal ideas. The patient is nervous/anxious.     Objective:  BP 106/62 (BP Location: Left Arm, Patient Position: Sitting, Cuff Size: Large)   Pulse 77   Temp 98.4 F (36.9 C) (Oral)   Ht 5\' 6"  (1.676 m)   Wt 191 lb (86.6 kg)   LMP 05/08/2014   SpO2 99%   BMI 30.83 kg/m   BP Readings from Last 3 Encounters:  04/06/17 106/62  01/12/17 104/62  12/03/16 104/66    Wt Readings from Last 3 Encounters:  04/06/17 191 lb (86.6 kg)  01/12/17 188 lb (85.3 kg)  12/03/16 190 lb (86.2 kg)    Physical Exam  Constitutional: She appears well-developed. No distress.  HENT:  Head: Normocephalic.  Right Ear: External ear normal.  Left Ear: External ear normal.  Nose: Nose normal.  Mouth/Throat: Oropharynx is clear and moist.  Eyes: Conjunctivae are normal. Pupils are equal, round, and reactive to light. Right eye exhibits no discharge. Left eye exhibits no discharge.  Neck: Normal range of motion. Neck supple. No JVD present. No tracheal deviation present. No thyromegaly present.  Cardiovascular: Normal rate, regular rhythm and normal heart sounds.  Pulmonary/Chest: No stridor. No respiratory distress. She has no wheezes.  Abdominal: Soft. Bowel sounds are normal.  She exhibits no distension and no mass. There is no tenderness. There is no rebound and no guarding.  Musculoskeletal: She exhibits no edema or tenderness.  Lymphadenopathy:    She has no cervical adenopathy.  Neurological: She displays normal reflexes. No cranial nerve deficit. She exhibits normal muscle tone. Coordination normal.  Skin: No rash noted. No erythema.  Psychiatric: She has a normal mood and affect. Her behavior is normal. Judgment and  thought content normal.  mid acne  Lab Results  Component Value Date   WBC 6.1 11/03/2016   HGB 12.8 11/03/2016   HCT 39.2 11/03/2016   PLT 415.0 (H) 11/03/2016   GLUCOSE 85 11/03/2016   CHOL 138 11/03/2016   TRIG 55.0 11/03/2016   HDL 62.60 11/03/2016   LDLCALC 64 11/03/2016   ALT 35 11/03/2016   AST 46 (H) 11/03/2016   NA 137 11/03/2016   K 3.9 11/03/2016   CL 103 11/03/2016   CREATININE 0.74 11/03/2016   BUN 10 11/03/2016   CO2 31 11/03/2016   TSH 2.67 11/03/2016   HGBA1C 5.9 02/28/2013    Mr Brain W Wo Contrast  Result Date: 11/23/2016 CLINICAL DATA:  History of concussion.  Follow-up abnormal CT EXAM: MRI HEAD WITHOUT AND WITH CONTRAST TECHNIQUE: Multiplanar, multiecho pulse sequences of the brain and surrounding structures were obtained without and with intravenous contrast. CONTRAST:  16mL MULTIHANCE GADOBENATE DIMEGLUMINE 529 MG/ML IV SOLN COMPARISON:  CT head 05/29/2016 FINDINGS: Brain: Image quality degraded by motion Negative for infarct, mass, or hemorrhage. Ventricle size normal. Normal enhancement of the brain Vascular: Normal arterial flow void.  Normal venous enhancement Skull and upper cervical spine: Multiple enhancing lesions in the calvarium as noted on CT. These are unchanged. These are located within the diploic space with 2 lesions present in the right parietal bone, and 2 lesions in the left parietal bone. No extraosseous soft tissue mass. The posterior lesions show ground-glass density on CT suggestive of fibrous dysplasia which can enhance. Sinuses/Orbits: Negative Other: None IMPRESSION: Image quality degraded by motion. 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 Patient has no known history of cancer making metastatic disease unlikely given the stability and appearance. Electronically Signed   By: Marlan Palau M.D.   On: 11/23/2016 14:56   US Abdomen  Limited Ruq  Result Date: 11/23/2016 CLINICAL DATA:  Elevated LFTs EXAM: ULTRASOUND ABDOMEN LIMITED RIGHT UPPER QUADRANT COMPARISON:  None. FINDINGS: Gallbladder: Prior cholecystectomy. Common bile duct: Diameter: 4.8 mm Liver: No focal hepatic mass. Increased hepatic echogenicity as can be seen with hepatic steatosis. Portal vein is patent on color Doppler imaging with normal direction of blood flow towards the liver. IMPRESSION: 1. Increased hepatic echogenicity as can be seen with hepatic steatosis. Electronically Signed   By: Elige Ko   On: 11/23/2016 09:22    Assessment & Plan:   There are no diagnoses linked to this encounter. I am having Kamilah Preston maintain her cholecalciferol, cyanocobalamin, ibuprofen, tretinoin, thiamine, LORazepam, Vitamin D (Ergocalciferol), sulfamethoxazole-trimethoprim, fluconazole, FLUoxetine, and omeprazole.  No orders of the defined types were placed in this encounter.    Follow-up: No Follow-up on file.  Sonda Primes, MD

## 2017-04-06 NOTE — Assessment & Plan Note (Signed)
Will try Ranitidine instead Pt wants to get off PPI

## 2017-04-06 NOTE — Assessment & Plan Note (Signed)
Retin A 

## 2017-04-06 NOTE — Assessment & Plan Note (Signed)
  We discussed age appropriate health related issues, including available/recomended screening tests and vaccinations. We discussed a need for adhering to healthy diet and exercise. Labs were ordered to be later reviewed . All questions were answered.  tDAP   

## 2017-04-06 NOTE — Assessment & Plan Note (Signed)
Labs

## 2017-04-06 NOTE — Addendum Note (Signed)
Addended by: Scarlett PrestoFRIEDENBACH, Nida Manfredi on: 04/06/2017 10:26 AM   Modules accepted: Orders

## 2017-04-06 NOTE — Assessment & Plan Note (Signed)
Fluoxetine, off Trazodone

## 2017-04-06 NOTE — Patient Instructions (Signed)
  Gluten free trial for 4-6 weeks. OK to use gluten-free bread and gluten-free pasta.    Gluten-Free Diet for Celiac Disease, Adult The gluten-free diet includes all foods that do not contain gluten. Gluten is a protein that is found in wheat, rye, barley, and some other grains. Following the gluten-free diet is the only treatment for people with celiac disease. It helps to prevent damage to the intestines and improves or eliminates the symptoms of celiac disease. Following the gluten-free diet requires some planning. It can be challenging at first, but it gets easier with time and practice. There are more gluten-free options available today than ever before. If you need help finding gluten-free foods or if you have questions, talk with your diet and nutrition specialist (registered dietitian) or your health care provider. What do I need to know about a gluten-free diet?  All fruits, vegetables, and meats are safe to eat and do not contain gluten.  When grocery shopping, start by shopping in the produce, meat, and dairy sections. These sections are more likely to contain gluten-free foods. Then move to the aisles that contain packaged foods if you need to.  Read all food labels. Gluten is often added to foods. Always check the ingredient list and look for warnings, such as "may contain gluten."  Talk with your dietitian or health care provider before taking a gluten-free multivitamin or mineral supplement.  Be aware of gluten-free foods having contact with foods that contain gluten (cross-contamination). This can happen at home and with any processed foods. ? Talk with your health care provider or dietitian about how to reduce the risk of cross-contamination in your home. ? If you have questions about how a food is processed, ask the manufacturer. What key words help to identify gluten? Foods that list any of these key words on the label usually contain gluten:  Wheat, flour, enriched  flour, bromated flour, white flour, durum flour, graham flour, phosphated flour, self-rising flour, semolina, farina, barley (malt), rye, and oats.  Starch, dextrin, modified food starch, or cereal.  Thickening, fillers, or emulsifiers.  Malt flavoring, malt extract, or malt syrup.  Hydrolyzed vegetable protein.  In the [place] packaged foods that are gluten-free are required to be labeled "GF." These foods should be easy to identify and are safe to eat. In the [place] food companies are also required to list common food allergens, including wheat, on their labels. Recommended foods Grains  Amaranth, bean flours, 100% buckwheat flour, corn, millet, nut flours or nut meals, GF oats, quinoa, rice, sorghum, teff, rice wafers, pure cornmeal tortillas, popcorn, and hot cereals made from cornmeal. Hominy, rice, wild rice. Some Asian rice noodles or bean noodles. Arrowroot starch, corn bran, corn flour, corn germ, cornmeal, corn starch, potato flour, potato starch flour, and rice bran. Plain, brown, and sweet rice flours. Rice polish, soy flour, and tapioca starch. Vegetables  All plain fresh, frozen, and canned vegetables. Fruits  All plain fresh, frozen, canned, and dried fruits, and 100% fruit juices. Meats and other protein foods  All fresh beef, pork, poultry, fish, seafood, and eggs. Fish canned in water, oil, brine, or vegetable broth. Plain nuts and seeds, peanut butter. Some lunch meat and some frankfurters. Dried beans, dried peas, and lentils. Dairy  Fresh plain, dry, evaporated, or condensed milk. Cream, butter, sour cream, whipping cream, and most yogurts. Unprocessed cheese, most processed cheeses, some cottage cheese, some cream cheeses. Beverages  Coffee, tea, most herbal teas. Carbonated beverages and some root beers.   Wine, sake, and distilled spirits, such as gin, vodka, and whiskey. Most hard ciders. Fats and oils  Butter, margarine, vegetable oil, hydrogenated butter, olive  oil, shortening, lard, cream, and some mayonnaise. Some commercial salad dressings. Olives. Sweets and desserts  Sugar, honey, some syrups, molasses, jelly, and jam. Plain hard candy, marshmallows, and gumdrops. Pure cocoa powder. Plain chocolate. Custard and some pudding mixes. Gelatin desserts, sorbets, frozen ice pops, and sherbet. Cake, cookies, and other desserts prepared with allowed flours. Some commercial ice creams. Cornstarch, tapioca, and rice puddings. Seasoning and other foods  Some canned or frozen soups. Monosodium glutamate (MSG). Cider, rice, and wine vinegar. Baking soda and baking powder. Cream of tartar. Baking and nutritional yeast. Certain soy sauces made without wheat (ask your dietitian about specific brands that are allowed). Nuts, coconut, and chocolate. Salt, pepper, herbs, spices, flavoring extracts, imitation or artificial flavorings, natural flavorings, and food colorings. Some medicines and supplements. Some lip glosses and other cosmetics. Rice syrups. The items listed may not be a complete list. Talk with your dietitian about what dietary choices are best for you. Foods to avoid Grains  Barley, bran, bulgur, couscous, cracked wheat, [place], farro, graham, malt, matzo, semolina, wheat germ, and all wheat and rye cereals including spelt and kamut. Cereals containing malt as a flavoring, such as rice cereal. Noodles, spaghetti, macaroni, most packaged rice mixes, and all mixes containing wheat, rye, barley, or triticale. Vegetables  Most creamed vegetables and most vegetables canned in sauces. Some commercially prepared vegetables and salads. Fruits  Thickened or prepared fruits and some pie fillings. Some fruit snacks and fruit roll-ups. Meats and other protein foods  Any meat or meat alternative containing wheat, rye, barley, or gluten stabilizers. These are often marinated or packaged meats and lunch meats. Bread-containing products, such as Swiss steak,  croquettes, meatballs, and meatloaf. Most tuna canned in vegetable broth and turkey with hydrolyzed vegetable protein (HVP) injected as part of the basting. Seitan. Imitation fish. Eggs in sauces made from ingredients to avoid. Dairy  Commercial chocolate milk drinks and malted milk. Some non-dairy creamers. Any cheese product containing ingredients to avoid. Beverages  Certain cereal beverages. Beer, ale, malted milk, and some root beers. Some hard ciders. Some instant flavored coffees. Some herbal teas made with barley or with barley malt added. Fats and oils  Some commercial salad dressings. Sour cream containing modified food starch. Sweets and desserts  Some toffees. Chocolate-coated nuts (may be rolled in wheat flour) and some commercial candies and candy bars. Most cakes, cookies, donuts, pastries, and other baked goods. Some commercial ice cream. Ice cream cones. Commercially prepared mixes for cakes, cookies, and other desserts. Bread pudding and other puddings thickened with flour. Products containing brown rice syrup made with barley malt enzyme. Desserts and sweets made with malt flavoring. Seasoning and other foods  Some curry powders, some dry seasoning mixes, some gravy extracts, some meat sauces, some ketchups, some prepared mustards, and horseradish. Certain soy sauces. Malt vinegar. Bouillon and bouillon cubes that contain HVP. Some chip dips, and some chewing gum. Yeast extract. Brewer's yeast. Caramel color. Some medicines and supplements. Some lip glosses and other cosmetics. The items listed may not be a complete list. Talk with your dietitian about what dietary choices are best for you. Summary  Gluten is a protein that is found in wheat, rye, barley, and some other grains. The gluten-free diet includes all foods that do not contain gluten.  If you need help finding gluten-free foods or if   you have questions, talk with your diet and nutrition specialist (registered  dietitian) or your health care provider.  Read all food labels. Gluten is often added to foods. Always check the ingredient list and look for warnings, such as "may contain gluten." This information is not intended to replace advice given to you by your health care provider. Make sure you discuss any questions you have with your health care provider. Document Released: 02/23/2005 Document Revised: 12/09/2015 Document Reviewed: 12/09/2015 Elsevier Interactive Patient Education  2018 Elsevier Inc.   

## 2017-04-06 NOTE — Assessment & Plan Note (Signed)
Re-start Vit D 

## 2017-04-06 NOTE — Assessment & Plan Note (Signed)
Margaret Preston

## 2017-04-15 ENCOUNTER — Telehealth: Payer: Self-pay

## 2017-04-15 NOTE — Telephone Encounter (Signed)
[employer]: [id number] submitted today for prior authorization for Tretinoin 0.025% EX CREAM

## 2017-04-15 NOTE — Telephone Encounter (Signed)
[employer]: [id number] - [place] Case : [id number]: [id number] - Rx #: [id number]  Rx approved today on Cover My Meds.

## 2017-04-26 ENCOUNTER — Other Ambulatory Visit (INDEPENDENT_AMBULATORY_CARE_PROVIDER_SITE_OTHER): Payer: Federal, State, Local not specified - PPO

## 2017-04-26 DIAGNOSIS — E559 Vitamin D deficiency, unspecified: Secondary | ICD-10-CM

## 2017-04-26 DIAGNOSIS — R930 Abnormal findings on diagnostic imaging of skull and head, not elsewhere classified: Secondary | ICD-10-CM

## 2017-04-26 DIAGNOSIS — E038 Other specified hypothyroidism: Secondary | ICD-10-CM

## 2017-04-26 DIAGNOSIS — S060X1A Concussion with loss of consciousness of 30 minutes or less, initial encounter: Secondary | ICD-10-CM

## 2017-04-26 DIAGNOSIS — R748 Abnormal levels of other serum enzymes: Secondary | ICD-10-CM | POA: Diagnosis not present

## 2017-04-26 DIAGNOSIS — E538 Deficiency of other specified B group vitamins: Secondary | ICD-10-CM

## 2017-04-26 LAB — BASIC METABOLIC PANEL
BUN: 12 mg/dL (ref 6–23)
CO2: 27 meq/L (ref 19–32)
Calcium: 9.4 mg/dL (ref 8.4–10.5)
Chloride: 105 mEq/L (ref 96–112)
Creatinine, Ser: 0.71 mg/dL (ref 0.40–1.20)
GFR: 108.78 mL/min (ref >60.00)
GLUCOSE: 76 mg/dL (ref 70–99)
POTASSIUM: 3.8 meq/L (ref 3.5–5.1)
SODIUM: 139 meq/L (ref 135–145)

## 2017-04-26 LAB — HEPATIC FUNCTION PANEL
ALT: 57 U/L — ABNORMAL HIGH (ref 0–35)
AST: 64 U/L — ABNORMAL HIGH (ref 0–37)
Albumin: 3.3 g/dL — ABNORMAL LOW (ref 3.5–5.2)
Alkaline Phosphatase: 338 U/L — ABNORMAL HIGH (ref 39–117)
BILIRUBIN DIRECT: 0.2 mg/dL (ref 0.0–0.3)
BILIRUBIN TOTAL: 0.6 mg/dL (ref 0.2–1.2)
Total Protein: 8.2 g/dL (ref 6.0–8.3)

## 2017-04-26 LAB — SEDIMENTATION RATE: Sed Rate: 34 mm/hr — ABNORMAL HIGH (ref 0–30)

## 2017-04-26 LAB — TSH: TSH: 2.26 u[IU]/mL (ref 0.35–4.50)

## 2017-04-26 LAB — VITAMIN D 25 HYDROXY (VIT D DEFICIENCY, FRACTURES): VITD: 44.96 ng/mL (ref 30.00–100.00)

## 2017-04-27 LAB — HEPATITIS C ANTIBODY
Hepatitis C Ab: NONREACTIVE
SIGNAL TO CUT-OFF: 0.03 (ref <1.00)

## 2017-04-27 LAB — HEPATITIS B SURFACE ANTIGEN: HEP B S AG: NONREACTIVE

## 2017-04-28 LAB — PROTEIN ELECTROPHORESIS, SERUM
ABNORMAL PROTEIN BAND1: 1.1 g/dL — AB
ALPHA 1: 0.3 g/dL (ref 0.2–0.3)
ALPHA 2: 0.7 g/dL (ref 0.5–0.9)
Albumin ELP: 3.5 g/dL — ABNORMAL LOW (ref 3.8–4.8)
BETA 2: 0.5 g/dL (ref 0.2–0.5)
Beta Globulin: 0.5 g/dL (ref 0.4–0.6)
GAMMA GLOBULIN: 2.5 g/dL — AB (ref 0.8–1.7)
Total Protein: 8 g/dL (ref 6.1–8.1)

## 2017-04-29 ENCOUNTER — Encounter: Payer: Self-pay | Admitting: Gastroenterology

## 2017-04-29 ENCOUNTER — Other Ambulatory Visit: Payer: Self-pay | Admitting: Internal Medicine

## 2017-04-29 ENCOUNTER — Telehealth: Payer: Self-pay | Admitting: Internal Medicine

## 2017-04-29 DIAGNOSIS — R748 Abnormal levels of other serum enzymes: Secondary | ICD-10-CM

## 2017-04-29 NOTE — Telephone Encounter (Signed)
noted 

## 2017-04-29 NOTE — Telephone Encounter (Signed)
Pt called concerned because GI called her and she had not received her results from her ab results yet, I read her the results and she expressed understanding. Also will be mailing her a copy of her results.  Has appt with GI in April

## 2017-06-09 ENCOUNTER — Encounter: Payer: Self-pay | Admitting: Gastroenterology

## 2017-06-09 ENCOUNTER — Other Ambulatory Visit (INDEPENDENT_AMBULATORY_CARE_PROVIDER_SITE_OTHER): Payer: Federal, State, Local not specified - PPO

## 2017-06-09 ENCOUNTER — Ambulatory Visit: Payer: Federal, State, Local not specified - PPO | Admitting: Gastroenterology

## 2017-06-09 VITALS — BP 108/62 | HR 70 | Ht 66.0 in | Wt 198.2 lb

## 2017-06-09 DIAGNOSIS — R945 Abnormal results of liver function studies: Secondary | ICD-10-CM

## 2017-06-09 DIAGNOSIS — R160 Hepatomegaly, not elsewhere classified: Secondary | ICD-10-CM

## 2017-06-09 DIAGNOSIS — K760 Fatty (change of) liver, not elsewhere classified: Secondary | ICD-10-CM | POA: Diagnosis not present

## 2017-06-09 DIAGNOSIS — R7989 Other specified abnormal findings of blood chemistry: Secondary | ICD-10-CM

## 2017-06-09 LAB — IBC PANEL
Iron: 122 ug/dL (ref 42–145)
SATURATION RATIOS: 37.9 % (ref 20.0–50.0)
Transferrin: 230 mg/dL (ref 212.0–360.0)

## 2017-06-09 LAB — PROTIME-INR
INR: 1 ratio (ref 0.8–1.0)
PROTHROMBIN TIME: 11 s (ref 9.6–13.1)

## 2017-06-09 LAB — GAMMA GT: GGT: 424 U/L — AB (ref 7–51)

## 2017-06-09 LAB — FERRITIN: FERRITIN: 109.3 ng/mL (ref 10.0–291.0)

## 2017-06-09 NOTE — Patient Instructions (Addendum)
If you are age 58 or older, your body mass index should be between 23-30. Your Body mass index is 32 kg/m. If this is out of the aforementioned range listed, please consider follow up with your Primary Care Provider.  If you are age 13 or younger, your body mass index should be between 19-25. Your Body mass index is 32 kg/m. If this is out of the aformentioned range listed, please consider follow up with your Primary Care Provider.   Your physician has requested that you go to the basement for lab work before leaving today.   You have been scheduled for a CT scan of the abdomen and pelvis at [facility] ([address]---this is in the same building as Press [facility]).   You are scheduled on 06-16-2017 at 330pm. You should arrive 15 minutes prior to your appointment time for registration. Please follow the written instructions below on the day of your exam:  WARNING: IF YOU ARE ALLERGIC TO IODINE/X-RAY DYE, PLEASE NOTIFY RADIOLOGY IMMEDIATELY AT [id number]! YOU WILL BE GIVEN A 13 HOUR PREMEDICATION PREP.  1) Do not eat or drink anything after 1130am (4 hours prior to your test) 2) You have been given 2 bottles of oral contrast to drink. The solution may taste better if refrigerated, but do NOT add ice or any other liquid to this solution. Shake well before drinking.    Drink 1 bottle of contrast @ 230pm (2 hours prior to your exam)   You may take any medications as prescribed with a small amount of water except for the following: Metformin, Glucophage, Glucovance, Avandamet, Riomet, Fortamet, Actoplus Met, Janumet, Glumetza or Metaglip. The above medications must be held the day of the exam AND 48 hours after the exam.  The purpose of you drinking the oral contrast is to aid in the visualization of your intestinal tract. The contrast solution may cause some diarrhea. Before your exam is started, you will be given a small amount of fluid to drink. Depending on your individual  set of symptoms, you may also receive an intravenous injection of x-ray contrast/dye. Plan on being at [facility] for 30 minutes or longer, depending on the type of exam you are having performed.  This test typically takes 30-45 minutes to complete.  If you have any questions regarding your exam or if you need to reschedule, you may call the CT department at [phone] between the hours of 8:00 am and 5:00 pm, Monday-Friday.  ________________________________________________________________________  Thank you for choosing [facility]  Dr Wilfrid Lund III

## 2017-06-09 NOTE — Progress Notes (Signed)
[facility] Consult Note:  History: Margaret Preston 06/09/2017  Referring physician: Tresa Garter, MD  Reason for consult/chief complaint: Elevated LFT; Gastroesophageal Reflux; and Right sided pain   Subjective  HPI:  This is a very pleasant 57 year old woman referred by primary care for abnormal LFTs.  This was apparently noted on routine labs about 6 months ago, they have been somewhat higher lately.  She denies abdominal pain, nausea, vomiting, dysphagia or early satiety.  She underwent gastric sleeve resection about 3 years ago in [place], and notes that the surgeon told her her liver looked "abnormal" during the surgery to a biopsy was taken.  She does not know those results, and cannot remember the name of that surgeon.  I have asked her to make an effort to find a surgeon and hopefully obtain at least the liver biopsy pathology report and possibly the operative report if available. She is not previously known to have any chronic liver disease.  She reports her alcohol use as heavy at times many years ago, less so in recent years.  Since a gastric sleeve surgery she has had to change her diet, and also has consume less alcohol.  On weekends she may have 4-5 drinks at a sitting.  Fortunately, she lost 150 pounds with the bariatric surgery. She has no history of IV drug use or hepatitis infection, no prior blood transfusions.  ROS:  Review of Systems  Constitutional: Negative for appetite change and unexpected weight change.  HENT: Negative for mouth sores and voice change.   Eyes: Negative for pain and redness.  Respiratory: Negative for cough and shortness of breath.   Cardiovascular: Negative for chest pain and palpitations.  Genitourinary: Negative for dysuria and hematuria.  Musculoskeletal: Negative for arthralgias and myalgias.  Skin: Negative for pallor and rash.  Neurological: Negative for weakness and headaches.  Hematological: Negative for  adenopathy.  Psychiatric/Behavioral:       She has chronic  anxiety     Past Medical History: Past Medical History:  Diagnosis Date  . Depression   . GERD (gastroesophageal reflux disease)   . Hypothyroidism 2009  . Insomnia   . Obesity   . Pancreatitis 2011  . Vitamin B12 deficiency 2009  . Vitamin D deficiency      Past Surgical History: Past Surgical History:  Procedure Laterality Date  . APPENDECTOMY    . CESAREAN SECTION    . CHOLECYSTECTOMY    . LAPAROSCOPIC GASTRIC SLEEVE RESECTION  09/05/2014   Cholecystectomy  Family History: Family History  Problem Relation Age of Onset  . Heart disease Mother        pacemaker  . Hypertension Other    Both brother and sister with fatty liver  Social History: Social History   Socioeconomic History  . Marital status: Divorced    Spouse name: Not on file  . Number of children: 1  . Years of education: Not on file  . Highest education level: Not on file  Occupational History  . Occupation: Teaching laboratory technician  Social Needs  . Financial resource strain: Not on file  . Food insecurity:    Worry: Not on file    Inability: Not on file  . Transportation needs:    Medical: Not on file    Non-medical: Not on file  Tobacco Use  . Smoking status: Never Smoker  . Smokeless tobacco: Never Used  Substance and Sexual Activity  . Alcohol use: Yes  . Drug use: No  .  Sexual activity: Not Currently  Lifestyle  . Physical activity:    Days per week: Not on file    Minutes per session: Not on file  . Stress: Not on file  Relationships  . Social connections:    Talks on phone: Not on file    Gets together: Not on file    Attends religious service: Not on file    Active member of club or organization: Not on file    Attends meetings of clubs or organizations: Not on file    Relationship status: Not on file  Other Topics Concern  . Not on file  Social History Narrative   Regular exercise-yes   [place]     Allergies: Allergies  Allergen Reactions  . Trazodone And Nefazodone     Too sedating    Outpatient Meds: Current Outpatient Medications  Medication Sig Dispense Refill  . cholecalciferol (VITAMIN D) 1000 UNITS tablet Take 1 tablet (1,000 Units total) by mouth daily. 100 tablet 3  . cyanocobalamin 1000 MCG tablet Take 0.5 tablets (500 mcg total) by mouth daily. 100 tablet 3  . FLUoxetine (PROZAC) 40 MG capsule TAKE 1 CAPSULE (40 MG TOTAL) BY MOUTH DAILY. 90 capsule 1  . LORazepam (ATIVAN) 2 MG tablet TAKE 1/2 TO 1 TABLET BY MOUTH TWICE A DAY AS NEEDED 180 tablet 0  . ranitidine (ZANTAC) 150 MG tablet Take 1 tablet (150 mg total) by mouth 2 (two) times daily. (Patient taking differently: Take 150 mg by mouth daily. ) 180 tablet 3  . thiamine (VITAMIN B-1) 100 MG tablet Take 1 tablet (100 mg total) by mouth daily. 100 tablet 1  . tretinoin (RETIN-A) 0.025 % cream APPLY TOPICALLY AT BEDTIME. ON FACE 45 g 3  . Vitamin D, Ergocalciferol, (DRISDOL) 50000 units CAPS capsule TAKE 1 CAPSULE (50,000 UNITS TOTAL) BY MOUTH ONCE A WEEK. 6 capsule 2   No current facility-administered medications for this visit.       ___________________________________________________________________ Objective   Exam:  BP 108/62   Pulse 70   Ht 5\' 6"  (1.676 m)   Wt 198 lb 4 oz (89.9 kg)   LMP 05/08/2014   BMI 32.00 kg/m    General: this is a(n) well-appearing woman  Eyes: sclera anicteric, no redness  ENT: oral mucosa moist without lesions, no cervical or supraclavicular lymphadenopathy, good dentition  CV: RRR without murmur, S1/S2, no JVD, no peripheral edema  Resp: clear to auscultation bilaterally, normal RR and effort noted  GI: soft, mild RLQ tenderness, with active bowel sounds.  Left lobe of liver enlarged, about 4 fingerbreadths on inspiration.  Skin; warm and dry, no rash or jaundice noted  Neuro: awake, alert and oriented x 3. Normal gross motor function and fluent  speech  Labs:  CMP Latest Ref Rng & Units 04/26/2017 04/26/2017 11/03/2016  Glucose 70 - 99 mg/dL - 76 85  BUN 6 - 23 mg/dL - 12 10  Creatinine 1.61 - 1.20 mg/dL - 0.96 0.45  Sodium 409 - 145 mEq/L - 139 137  Potassium 3.5 - 5.1 mEq/L - 3.8 3.9  Chloride 96 - 112 mEq/L - 105 103  CO2 19 - 32 mEq/L - 27 31  Calcium 8.4 - 10.5 mg/dL - 9.4 9.9  Total Protein 6.1 - 8.1 g/dL 8.0 8.2 8.1(X)  Total Bilirubin 0.2 - 1.2 mg/dL - 0.6 0.4  Alkaline Phos 39 - 117 U/L - 338(H) 310(H)  AST 0 - 37 U/L - 64(H) 46(H)  ALT  0 - 35 U/L - 57(H) 35    April 2017   AST/ALT nml, AP 207, T bili 0.6 Negative Hep B Surface Antigen and HCV Ab 04/26/17 Neg HIV test in 2012  Radiologic Studies:  11/23/16 US report:  Increased hepatic echotexture.  Prior chole, CBD 5mm  Assessment: Encounter Diagnoses  Name Primary?  . Elevated LFTs Yes  . Hepatomegaly   . Fatty liver     Elevated LFTs, primarily alkaline phosphatase with normal bilirubin and mild transaminitis.  The differential is broad, however it does not seem to be drug-induced.  It was a time she was taking ibuprofen frequently, but does not do so any longer and the alkaline phosphatase has continued to rise over the last 6 months.  Possible autoimmune liver disease or PSC, no evidence of extrahepatic obstruction on ultrasound.  Apparent fatty liver by ultrasound, less likely some sort of infiltrative hepatic disease.  I am concerned she may have developed cirrhosis with an enlarged left lobe on exam.  Plan:  Lab workup including iron levels, AMA, ANA, ASMA, hepatitis B surface antibody, total hepatitis A antibody CT abdomen with oral and IV contrast from more detailed evaluation of the liver. I will then contact her with results and plan for follow-up and further testing if needed.  I have advised her to decrease alcohol intake, which she is definitely amenable to.  Thank you for the courtesy of this consult.  Please call me with any questions or  concerns.  Charlie PitterHenry L Danis III  CC: Plotnikov, Georgina QuintAleksei V, MD

## 2017-06-11 LAB — MITOCHONDRIAL ANTIBODIES: Mitochondrial M2 Ab, IgG: <=20 U

## 2017-06-11 LAB — ANA: ANA: NEGATIVE

## 2017-06-11 LAB — HEPATITIS A ANTIBODY, TOTAL: Hepatitis A AB,Total: NONREACTIVE

## 2017-06-11 LAB — HEPATITIS B SURFACE ANTIBODY,QUALITATIVE: Hep B S Ab: NONREACTIVE

## 2017-06-16 ENCOUNTER — Other Ambulatory Visit: Payer: Self-pay | Admitting: Internal Medicine

## 2017-06-16 ENCOUNTER — Inpatient Hospital Stay: Admission: RE | Admit: 2017-06-16 | Payer: Federal, State, Local not specified - PPO | Source: Ambulatory Visit

## 2017-06-16 ENCOUNTER — Other Ambulatory Visit: Payer: Self-pay

## 2017-06-16 DIAGNOSIS — R945 Abnormal results of liver function studies: Principal | ICD-10-CM

## 2017-06-16 DIAGNOSIS — R160 Hepatomegaly, not elsewhere classified: Secondary | ICD-10-CM

## 2017-06-16 DIAGNOSIS — K760 Fatty (change of) liver, not elsewhere classified: Secondary | ICD-10-CM

## 2017-06-16 DIAGNOSIS — R7989 Other specified abnormal findings of blood chemistry: Secondary | ICD-10-CM

## 2017-06-16 NOTE — Telephone Encounter (Signed)
Routing to dr plotnikov, please advise, thanks 

## 2017-06-23 ENCOUNTER — Ambulatory Visit (INDEPENDENT_AMBULATORY_CARE_PROVIDER_SITE_OTHER)
Admission: RE | Admit: 2017-06-23 | Discharge: 2017-06-23 | Disposition: A | Discharge status: Home / Self Care | Payer: Federal, State, Local not specified - PPO | Source: Ambulatory Visit | Attending: Gastroenterology | Admitting: Gastroenterology

## 2017-06-23 DIAGNOSIS — R160 Hepatomegaly, not elsewhere classified: Secondary | ICD-10-CM | POA: Diagnosis not present

## 2017-06-23 DIAGNOSIS — R7989 Other specified abnormal findings of blood chemistry: Secondary | ICD-10-CM

## 2017-06-23 DIAGNOSIS — R945 Abnormal results of liver function studies: Secondary | ICD-10-CM | POA: Diagnosis not present

## 2017-06-23 DIAGNOSIS — K760 Fatty (change of) liver, not elsewhere classified: Secondary | ICD-10-CM

## 2017-06-23 DIAGNOSIS — K7460 Unspecified cirrhosis of liver: Secondary | ICD-10-CM | POA: Diagnosis not present

## 2017-06-23 MED ORDER — IOPAMIDOL (ISOVUE-300) INJECTION 61%
100.0000 mL | Freq: Once | INTRAVENOUS | Status: AC | PRN
  Administered 2017-06-23: 100 mL via INTRAVENOUS

## 2017-07-05 ENCOUNTER — Telehealth: Payer: Self-pay | Admitting: Gastroenterology

## 2017-07-05 NOTE — Telephone Encounter (Signed)
Talked to patient about lab and CT results.  She is aware of the cirrhosis, which is a new diagnosis for her.  She had not yet tried to find the surgeon who did her bariatric surgery and performed a liver biopsy at that time.  Over the course of 20 minutes we discussed alcohol as apparent cause, need to stop, and she is ready to do so.  She typically drinks on weekends, uncommonly during the week, so it sounds like she can stop without risk of alcohol withdrawal.   I informed her about the increased risk of liver cancer, and the need for ongoing screening.  No liver mass seen on CT scan.  She needs some more blood work and an EGD to screen for varices.  Agreeable.  Please arrange labs: smooth muscle (actin) antibody and alpha feto protein (AFP).  Schedule EGD in [facility] for cirrhosis - variceal screening.  Margaret Preston was initially upset by the diagnosis, recovered and plans to call family this evening to  discuss with them.  She was encouraged to call back with questions or concerns.

## 2017-07-06 ENCOUNTER — Other Ambulatory Visit: Payer: Self-pay

## 2017-07-06 DIAGNOSIS — K7460 Unspecified cirrhosis of liver: Secondary | ICD-10-CM

## 2017-07-06 NOTE — Telephone Encounter (Signed)
Yes, the EGD can certainly wait until Margaret Preston is ready.  It is routine for variceal screening.

## 2017-07-06 NOTE — Telephone Encounter (Signed)
Spoke to patient about setting up EGD. She is at the hospital, death in her family. She asked if she could call back at a later time. I will wait to hear back from her, but will also set up a reminder for 2 weeks to contact her if I have not heard back. Labs have been ordered.

## 2017-08-05 ENCOUNTER — Telehealth: Payer: Self-pay | Admitting: Gastroenterology

## 2017-08-05 NOTE — Telephone Encounter (Signed)
Understood. We will wait to hear from her when she is ready.

## 2017-08-05 NOTE — Telephone Encounter (Signed)
I had a reminder to myself to call patient to reschedule: Call patient to schedule EGD at [facility] to screen for varices (cirrhosis). Remind patient to get lab work done, orders in [employer] [facility] [employer].   Patient's uncle passed away 07/24/22), she asked if she could call back at a later time to schedule.   Patient called back to leave [place] a message that she does not wish to schedule a procedure, please see phone message.

## 2017-10-19 ENCOUNTER — Encounter: Payer: Self-pay | Admitting: Internal Medicine

## 2017-10-19 ENCOUNTER — Ambulatory Visit: Payer: Federal, State, Local not specified - PPO | Admitting: Internal Medicine

## 2017-10-19 DIAGNOSIS — E559 Vitamin D deficiency, unspecified: Secondary | ICD-10-CM

## 2017-10-19 DIAGNOSIS — K219 Gastro-esophageal reflux disease without esophagitis: Secondary | ICD-10-CM

## 2017-10-19 DIAGNOSIS — E538 Deficiency of other specified B group vitamins: Secondary | ICD-10-CM | POA: Diagnosis not present

## 2017-10-19 DIAGNOSIS — R1011 Right upper quadrant pain: Secondary | ICD-10-CM

## 2017-10-19 DIAGNOSIS — R748 Abnormal levels of other serum enzymes: Secondary | ICD-10-CM

## 2017-10-19 DIAGNOSIS — K7460 Unspecified cirrhosis of liver: Secondary | ICD-10-CM | POA: Diagnosis not present

## 2017-10-19 MED ORDER — DEXLANSOPRAZOLE 30 MG PO CPDR: 30.0000 mg | DELAYED_RELEASE_CAPSULE | Freq: Every day | ORAL | 3 refills | Status: DC

## 2017-10-19 NOTE — Assessment & Plan Note (Signed)
Vit D 

## 2017-10-19 NOTE — Assessment & Plan Note (Signed)
?  etiology: gastritis vs scar tissue vs other Stop beer/wine Dexilant and Zantac

## 2017-10-19 NOTE — Patient Instructions (Signed)
No alcohol  

## 2017-10-19 NOTE — Assessment & Plan Note (Signed)
S/p lap gastric sleeve

## 2017-10-19 NOTE — Assessment & Plan Note (Signed)
Chronic  Dexilant   Potential benefits of a long term PPI use as well as potential risks  and complications were explained to the patient and were aknowledged. H2 blockers - Zantac

## 2017-10-19 NOTE — Assessment & Plan Note (Signed)
Labs

## 2017-10-19 NOTE — Assessment & Plan Note (Signed)
2018 Dr Myrtie Neitheranis NASH vs ETOH related  CT IMPRESSION: 1. Cirrhosis.  No evidence of hepatocellular carcinoma. 2. Status post gastric sleeve procedure.  Tiny hiatal hernia. 3. Peripheral precontrast hyperattenuation within a relatively diminutive spleen could represent remote ischemic insult. Relative soft tissue fullness within the inferior spleen could represent retained splenic parenchyma or a true mass. Presuming the patient undergoes imaging surveillance for hepatocellular carcinoma, recommend attention. Alternatively, this could be re-evaluated with follow-up pre and post contrast abdominal MRI at 6 months.   Electronically Signed   By: Jeronimo GreavesKyle  Talbot M.D.   On: 06/23/2017 12:00

## 2017-10-19 NOTE — Progress Notes (Signed)
Subjective:  Patient ID: Margaret Preston, female    DOB: 06/19/1960  Age: 57 y.o. MRN: [id number]  CC: No chief complaint on file.   HPI Margaret CossRaymondria Kearley presents for RUQ pain >1 year Worse lately. Hurts at rest 6/10. C/o bad GERD every night  Outpatient Medications Prior to Visit  Medication Sig Dispense Refill  . cholecalciferol (VITAMIN D) 1000 UNITS tablet Take 1 tablet (1,000 Units total) by mouth daily. 100 tablet 3  . cyanocobalamin 1000 MCG tablet Take 0.5 tablets (500 mcg total) by mouth daily. 100 tablet 3  . FLUoxetine (PROZAC) 40 MG capsule TAKE 1 CAPSULE (40 MG TOTAL) BY MOUTH DAILY. 90 capsule 1  . LORazepam (ATIVAN) 2 MG tablet TAKE 1/2 TO 1 TABLET BY MOUTH TWICE A DAY AS NEEDED 180 tablet 1  . ranitidine (ZANTAC) 150 MG tablet Take 1 tablet (150 mg total) by mouth 2 (two) times daily. (Patient taking differently: Take 150 mg by mouth daily. ) 180 tablet 3  . thiamine (VITAMIN B-1) 100 MG tablet Take 1 tablet (100 mg total) by mouth daily. 100 tablet 1  . tretinoin (RETIN-A) 0.025 % cream APPLY TOPICALLY AT BEDTIME. ON FACE 45 g 3  . Vitamin D, Ergocalciferol, (DRISDOL) 50000 units CAPS capsule TAKE 1 CAPSULE (50,000 UNITS TOTAL) BY MOUTH ONCE A WEEK. 6 capsule 2   No facility-administered medications prior to visit.     ROS: Review of Systems  Constitutional: Negative for activity change, appetite change, chills, fatigue and unexpected weight change.  HENT: Negative for congestion, mouth sores and sinus pressure.   Eyes: Negative for visual disturbance.  Respiratory: Negative for cough and chest tightness.   Gastrointestinal: Negative for abdominal pain and nausea.  Genitourinary: Negative for difficulty urinating, frequency and vaginal pain.  Musculoskeletal: Negative for back pain and gait problem.  Skin: Negative for pallor and rash.  Neurological: Negative for dizziness, tremors, weakness, numbness and headaches.  Psychiatric/Behavioral: Positive for  dysphoric mood. Negative for confusion, sleep disturbance and suicidal ideas. The patient is nervous/anxious.     Objective:  BP 112/72 (BP Location: Left Arm, Patient Position: Sitting, Cuff Size: Large)   Pulse (!) 54   Temp 98.1 F (36.7 C) (Oral)   Ht 5\' 6"  (1.676 m)   Wt 199 lb (90.3 kg)   LMP 05/08/2014   SpO2 97%   BMI 32.12 kg/m   BP Readings from Last 3 Encounters:  10/19/17 112/72  06/09/17 108/62  04/06/17 106/62    Wt Readings from Last 3 Encounters:  10/19/17 199 lb (90.3 kg)  06/09/17 198 lb 4 oz (89.9 kg)  04/06/17 191 lb (86.6 kg)    Physical Exam  Constitutional: She appears well-developed. No distress.  HENT:  Head: Normocephalic.  Right Ear: External ear normal.  Left Ear: External ear normal.  Nose: Nose normal.  Mouth/Throat: Oropharynx is clear and moist.  Eyes: Pupils are equal, round, and reactive to light. Conjunctivae are normal. Right eye exhibits no discharge. Left eye exhibits no discharge.  Neck: Normal range of motion. Neck supple. No JVD present. No tracheal deviation present. No thyromegaly present.  Cardiovascular: Normal rate, regular rhythm and normal heart sounds.  Pulmonary/Chest: No stridor. No respiratory distress. She has no wheezes.  Abdominal: Soft. Bowel sounds are normal. She exhibits no distension and no mass. There is tenderness. There is no rebound and no guarding.  Musculoskeletal: She exhibits no edema or tenderness.  Lymphadenopathy:    She has no cervical adenopathy.  Neurological: She  displays normal reflexes. No cranial nerve deficit. She exhibits normal muscle tone. Coordination normal.  Skin: No rash noted. No erythema.  Psychiatric: She has a normal mood and affect. Her behavior is normal. Judgment and thought content normal.  epig and RUQ area - tender No hernia  Lab Results  Component Value Date   WBC 6.1 11/03/2016   HGB 12.8 11/03/2016   HCT 39.2 11/03/2016   PLT 415.0 (H) 11/03/2016   GLUCOSE 76  04/26/2017   CHOL 138 11/03/2016   TRIG 55.0 11/03/2016   HDL 62.60 11/03/2016   LDLCALC 64 11/03/2016   ALT 57 (H) 04/26/2017   AST 64 (H) 04/26/2017   NA 139 04/26/2017   K 3.8 04/26/2017   CL 105 04/26/2017   CREATININE 0.71 04/26/2017   BUN 12 04/26/2017   CO2 27 04/26/2017   TSH 2.26 04/26/2017   INR 1.0 06/09/2017   HGBA1C 5.9 02/28/2013    Ct Abdomen W Wo Contrast  Result Date: 06/23/2017 CLINICAL DATA:  Elevated liver function tests. Hepatomegaly. Family history of fatty liver. Right-sided pain. History pancreatitis. Morbid obesity. EXAM: CT ABDOMEN WITHOUT AND WITH CONTRAST TECHNIQUE: Multidetector CT imaging of the abdomen was performed following the standard protocol before and following the bolus administration of intravenous contrast. CONTRAST:  100mL ISOVUE-300 IOPAMIDOL (ISOVUE-300) INJECTION 61% COMPARISON:  11/23/2016 ultrasound. FINDINGS: Lower chest: Bibasilar scarring. Borderline cardiomegaly, without pericardial or pleural effusion. Surgical changes at the gastroesophageal junction. Tiny hiatal hernia. Hepatobiliary: Atypical hepatic morphology, most consistent with cirrhosis. Irregular hepatic capsule with lobulated contours, including involving the lateral segment left liver lobe on image 15/7. The arterial phase images are early. Given this limitation, no hypervascular lesions are seen. No hypoenhancing portal venous or delayed phase lesions identified. Cholecystectomy, without biliary ductal dilatation. Pancreas: Normal, without mass or ductal dilatation. Spleen: Precontrast hyper attenuation throughout the spleen, favored to represent calcification. Contour bulge in the inferior spleen including at 2.9 cm on image 35/4. Adrenals/Urinary Tract: Normal adrenal glands. No renal calculi or hydronephrosis. No renal mass. Stomach/Bowel: Prior gastric sleeve procedure. Normal colon and terminal ileum. Normal small bowel. Vascular/Lymphatic: Normal caliber of the aorta and  branch vessels. Patent portal and splenic veins. No specific evidence of portal venous hypertension. No retroperitoneal or retrocrural adenopathy. Other: No ascites. Musculoskeletal: Degenerative disc disease at L4-5. Multiple vertebral lucent lesions are favored to represent hemangiomas. IMPRESSION: 1. Cirrhosis.  No evidence of hepatocellular carcinoma. 2. Status post gastric sleeve procedure.  Tiny hiatal hernia. 3. Peripheral precontrast hyperattenuation within a relatively diminutive spleen could represent remote ischemic insult. Relative soft tissue fullness within the inferior spleen could represent retained splenic parenchyma or a true mass. Presuming the patient undergoes imaging surveillance for hepatocellular carcinoma, recommend attention. Alternatively, this could be re-evaluated with follow-up pre and post contrast abdominal MRI at 6 months. Electronically Signed   By: Jeronimo GreavesKyle  Talbot M.D.   On: 06/23/2017 12:00    Assessment & Plan:   There are no diagnoses linked to this encounter.   No orders of the defined types were placed in this encounter.    Follow-up: No follow-ups on file.  Sonda PrimesAlex Plotnikov, MD

## 2017-10-19 NOTE — Assessment & Plan Note (Signed)
Vit B12 

## 2017-10-28 ENCOUNTER — Other Ambulatory Visit: Payer: Self-pay | Admitting: Internal Medicine

## 2017-10-29 MED ORDER — FLUOXETINE HCL 40 MG PO CAPS: 40.0000 mg | ORAL_CAPSULE | Freq: Every day | ORAL | 1 refills | Status: DC

## 2017-10-29 NOTE — Addendum Note (Signed)
Addended by: Scarlett PrestoFRIEDENBACH, Luberta Grabinski on: 10/29/2017 08:44 AM   Modules accepted: Orders

## 2017-11-09 ENCOUNTER — Telehealth: Payer: Self-pay | Admitting: *Deleted

## 2017-11-09 MED ORDER — DEXLANSOPRAZOLE 30 MG PO CPDR: 30.0000 mg | DELAYED_RELEASE_CAPSULE | Freq: Every day | ORAL | 1 refills | Status: DC

## 2017-11-09 MED ORDER — RANITIDINE HCL 150 MG PO TABS: 150.0000 mg | ORAL_TABLET | Freq: Two times a day (BID) | ORAL | 1 refills | Status: DC

## 2017-11-09 NOTE — Telephone Encounter (Signed)
Sent rx for the generic for the pt dexilant.Raechel Chute  Copied from CRM [id number]. Topic: Quick Communication - See Telephone Encounter >> Nov 09, 2017  3:30 PM Waymon Amato wrote: Pt is needing to get her dexilant filled and the pharmacy is telling her in order for it to be covered provider needs to go to website either my prescriptions or your [email or web address] and list the generic version of her acid r Reflux and the dexilant she is currently taking   Best number [phone]

## 2017-11-11 NOTE — Telephone Encounter (Signed)
Pt has [employer]. She said that dexilant is helping for her. She had samples first from Dr. Posey Rea. It is the only thing that has worked for her. She said that we need to go to the website and indicate that the pt has trialed and failed at least 2 other medications in the past before they will approve dexilant. Her cost without this is $1000. Please call pt back to follow up.

## 2017-11-11 NOTE — Telephone Encounter (Signed)
PA approved through 11/11/18, unable to reach pt, called pharmacy

## 2018-02-26 ENCOUNTER — Other Ambulatory Visit: Payer: Self-pay

## 2018-02-26 ENCOUNTER — Emergency Department (HOSPITAL_COMMUNITY)
Admission: EM | Admit: 2018-02-26 | Discharge: 2018-02-27 | Disposition: A | Discharge status: Home / Self Care | Payer: Federal, State, Local not specified - PPO | Attending: Emergency Medicine | Admitting: Emergency Medicine

## 2018-02-26 DIAGNOSIS — G44209 Tension-type headache, unspecified, not intractable: Secondary | ICD-10-CM | POA: Diagnosis not present

## 2018-02-26 DIAGNOSIS — Z79899 Other long term (current) drug therapy: Secondary | ICD-10-CM | POA: Diagnosis not present

## 2018-02-26 DIAGNOSIS — R51 Headache: Secondary | ICD-10-CM | POA: Diagnosis not present

## 2018-02-26 DIAGNOSIS — E039 Hypothyroidism, unspecified: Secondary | ICD-10-CM | POA: Diagnosis not present

## 2018-02-26 MED ORDER — METOCLOPRAMIDE HCL 5 MG/ML IJ SOLN
10.0000 mg | Freq: Once | INTRAMUSCULAR | Status: AC
  Administered 2018-02-26: 10 mg via INTRAVENOUS
  Filled 2018-02-26: qty 2

## 2018-02-26 MED ORDER — SODIUM CHLORIDE 0.9 % IV BOLUS
1000.0000 mL | Freq: Once | INTRAVENOUS | Status: AC
  Administered 2018-02-26: 1000 mL via INTRAVENOUS

## 2018-02-26 MED ORDER — DIPHENHYDRAMINE HCL 50 MG/ML IJ SOLN
25.0000 mg | Freq: Once | INTRAMUSCULAR | Status: AC
  Administered 2018-02-26: 25 mg via INTRAVENOUS
  Filled 2018-02-26: qty 1

## 2018-02-26 MED ORDER — KETOROLAC TROMETHAMINE 30 MG/ML IJ SOLN
30.0000 mg | Freq: Once | INTRAMUSCULAR | Status: AC
  Administered 2018-02-26: 30 mg via INTRAVENOUS
  Filled 2018-02-26: qty 1

## 2018-02-26 NOTE — ED Provider Notes (Signed)
[facility]PT Provider Note   CSN: [id number] Arrival date & time: 02/26/18  2157     History   Chief Complaint Chief Complaint  Patient presents with  . Headache    HPI Margaret Preston is a 57 y.o. female.  Patient presents to the emergency department with a chief complaint of headache.  She states that she woke with a headache yesterday after sleeping on a new pillow.  She states that the pain radiates from her left upper shoulder and neck to her head.  It is worsened with palpation and with turning her head.  She denies any fevers or chills.  Denies any numbness, weakness, or tingling.  She has tried taking Tylenol with some relief.  Denies any vision or speech changes.  The history is provided by the patient. No language interpreter was used.    Past Medical History:  Diagnosis Date  . Depression   . GERD (gastroesophageal reflux disease)   . Hypothyroidism 2009  . Insomnia   . Obesity   . Pancreatitis 2011  . Vitamin B12 deficiency 2009  . Vitamin D deficiency     Patient Active Problem List   Diagnosis Date Noted  . Liver cirrhosis (HCC) 10/19/2017  . RUQ pain 10/19/2017  . Elevated liver enzymes 01/12/2017  . Urinary frequency 12/03/2016  . Abnormal CT scan, head 11/03/2016  . Mild concussion 06/02/2016  . Benign paroxysmal positional vertigo 10/08/2015  . Headache 10/08/2015  . Ataxia 10/08/2015  . 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 Well adult exam 03/01/2013  . Obesity, morbid (HCC) 05/18/2012  . Anxiety state 05/26/2010  . UPPER RESPIRATORY INFECTION, ACUTE 05/26/2010  . MENORRHAGIA 05/26/2010  . Adult acne 07/09/2009  . GERD 07/01/2009  . PANCREATITIS, HX OF 07/01/2009  . MUSCULOSKELETAL PAIN 03/28/2009  . CHEST PAIN UNSPECIFIED 03/28/2009  . Vitamin D deficiency 06/20/2007  . Hypothyroidism 06/15/2007  . B12 deficiency 06/14/2007  . Depression 06/14/2007  . FATIGUE 06/14/2007  . PARESTHESIA  06/14/2007  . Abnormal weight gain 06/14/2007    Past Surgical History:  Procedure Laterality Date  . APPENDECTOMY    . CESAREAN SECTION    . CHOLECYSTECTOMY    . LAPAROSCOPIC GASTRIC SLEEVE RESECTION  09/05/2014     OB History   No obstetric history on file.      Home Medications    Prior to Admission medications   Medication Sig Start Date End Date Taking? Authorizing Provider  cholecalciferol (VITAMIN D) 1000 UNITS tablet Take 1 tablet (1,000 Units total) by mouth daily. 06/21/13   Plotnikov, Georgina Quint, MD  cyanocobalamin 1000 MCG tablet Take 0.5 tablets (500 mcg total) by mouth daily. 06/21/13   Plotnikov, Georgina Quint, MD  Dexlansoprazole (DEXILANT) 30 MG capsule Take 1 capsule (30 mg total) by mouth daily. 11/09/17 12/09/17  Plotnikov, Georgina Quint, MD  FLUoxetine (PROZAC) 40 MG capsule Take 1 capsule (40 mg total) by mouth daily. 10/29/17   Plotnikov, Georgina Quint, MD  LORazepam (ATIVAN) 2 MG tablet TAKE 1/2 TO 1 TABLET BY MOUTH TWICE A DAY AS NEEDED 06/17/17   Plotnikov, Georgina Quint, MD  ranitidine (ZANTAC) 150 MG tablet Take 1 tablet (150 mg total) by mouth 2 (two) times daily. 11/09/17 11/09/18  Plotnikov, Georgina Quint, MD  thiamine (VITAMIN B-1) 100 MG tablet Take 1 tablet (100 mg total) by mouth daily. 10/31/15   Plotnikov, Georgina Quint, MD  tretinoin (RETIN-A) 0.025 % cream APPLY TOPICALLY AT BEDTIME. ON FACE 04/06/17   Plotnikov,  Georgina QuintAleksei V, MD  Vitamin D, Ergocalciferol, (DRISDOL) 50000 units CAPS capsule TAKE 1 CAPSULE (50,000 UNITS TOTAL) BY MOUTH ONCE A WEEK. 11/17/16   Plotnikov, Georgina QuintAleksei V, MD    Family History Family History  Problem Relation Age of Onset  . Heart disease Mother        pacemaker  . Hypertension Other     Social History Social History   Tobacco Use  . Smoking status: Never Smoker  . Smokeless tobacco: Never Used  Substance Use Topics  . Alcohol use: Yes  . Drug use: No     Allergies   Trazodone and nefazodone   Review of Systems Review of Systems  All  other systems reviewed and are negative.    Physical Exam Updated Vital Signs BP 132/75 (BP Location: Right Arm)   Pulse 61   Temp 98.1 F (36.7 C) (Oral)   Resp 18   Ht 5\' 6"  (1.676 m)   Wt 88.5 kg   LMP 05/08/2014   SpO2 97%   BMI 31.47 kg/m   Physical Exam Vitals signs and nursing note reviewed.  Constitutional:      Appearance: She is well-developed.  HENT:     Head: Normocephalic and atraumatic.     Right Ear: External ear normal.     Left Ear: External ear normal.  Eyes:     Conjunctiva/sclera: Conjunctivae normal.     Pupils: Pupils are equal, round, and reactive to light.  Neck:     Musculoskeletal: Normal range of motion and neck supple.     Comments: No pain with neck flexion, no meningismus Cardiovascular:     Rate and Rhythm: Normal rate and regular rhythm.     Heart sounds: Normal heart sounds. No murmur. No friction rub. No gallop.   Pulmonary:     Effort: Pulmonary effort is normal. No respiratory distress.     Breath sounds: Normal breath sounds. No wheezing or rales.  Chest:     Chest wall: No tenderness.  Abdominal:     General: There is no distension.     Palpations: Abdomen is soft. There is no mass.     Tenderness: There is no abdominal tenderness. There is no guarding or rebound.  Musculoskeletal: Normal range of motion.        General: No tenderness.     Comments: Normal gait.  Skin:    General: Skin is warm and dry.  Neurological:     Mental Status: She is alert and oriented to person, place, and time.     Deep Tendon Reflexes: Reflexes are normal and symmetric.     Comments: CN 3-12 intact, normal finger to nose, no pronator drift, sensation and strength intact bilaterally.  Psychiatric:        Behavior: Behavior normal.        Thought Content: Thought content normal.        Judgment: Judgment normal.      ED Treatments / Results  Labs (all labs ordered are listed, but only abnormal results are displayed) Labs Reviewed - No data  to display  EKG None  Radiology No results found.  Procedures Procedures (including critical care time)  Medications Ordered in ED Medications  ketorolac (TORADOL) 30 MG/ML injection 30 mg (has no administration in time range)  metoCLOPramide (REGLAN) injection 10 mg (has no administration in time range)  diphenhydrAMINE (BENADRYL) injection 25 mg (has no administration in time range)  sodium chloride 0.9 % bolus 1,000 mL (has no  administration in time range)     Initial Impression / Assessment and Plan / ED Course  I have reviewed the triage vital signs and the nursing notes.  Pertinent labs & imaging results that were available during my care of the patient were reviewed by me and considered in my medical decision making (see chart for details).     Pt HA treated and improved while in ED.  Symptoms seem to originate in the upper trapezius.  Presentation is inconsistent with SAH, ICH, Meningitis, or temporal arteritis. Pt is afebrile with no focal neuro deficits, nuchal rigidity, or change in vision. Pt is to follow up with PCP to discuss prophylactic medication. Pt verbalizes understanding and is agreeable with plan to dc.    Final Clinical Impressions(s) / ED Diagnoses   Final diagnoses:  Tension headache    ED Discharge Orders         Ordered    ibuprofen (ADVIL,MOTRIN) 800 MG tablet  3 times daily     02/27/18 0033    cyclobenzaprine (FLEXERIL) 10 MG tablet  2 times daily PRN     02/27/18 0033           Roxy HorsemanBrowning, Kentravious Lipford, PA-C 02/27/18 0034    Dione BoozeGlick, David, MD 02/27/18 [phone]

## 2018-02-26 NOTE — ED Triage Notes (Signed)
Pt states started having pain from the Lt side of head down LT shoulder to LT breast since yesterday.  States it's progressively worsened since.  Denies trauma of any kind. States the pain is a throb and is constant.  Denies any other sx's but does have slight nausea.

## 2018-02-26 NOTE — ED Notes (Signed)
No neuro deficits noted.  Denies any rash or fevers.

## 2018-02-27 ENCOUNTER — Other Ambulatory Visit: Payer: Self-pay

## 2018-02-27 MED ORDER — IBUPROFEN 800 MG PO TABS: 800.0000 mg | ORAL_TABLET | Freq: Three times a day (TID) | ORAL | 0 refills | Status: DC

## 2018-02-27 MED ORDER — CYCLOBENZAPRINE HCL 10 MG PO TABS: 10.0000 mg | ORAL_TABLET | Freq: Two times a day (BID) | ORAL | 0 refills | Status: DC | PRN

## 2018-02-28 ENCOUNTER — Other Ambulatory Visit: Payer: Self-pay

## 2018-02-28 ENCOUNTER — Emergency Department (HOSPITAL_COMMUNITY)
Admission: EM | Admit: 2018-02-28 | Discharge: 2018-02-28 | Disposition: A | Discharge status: Home / Self Care | Payer: Federal, State, Local not specified - PPO | Attending: Emergency Medicine | Admitting: Emergency Medicine

## 2018-02-28 ENCOUNTER — Encounter: Payer: Self-pay | Admitting: Internal Medicine

## 2018-02-28 ENCOUNTER — Encounter (HOSPITAL_COMMUNITY): Payer: Self-pay | Admitting: Emergency Medicine

## 2018-02-28 ENCOUNTER — Emergency Department (HOSPITAL_COMMUNITY): Payer: Federal, State, Local not specified - PPO

## 2018-02-28 ENCOUNTER — Ambulatory Visit: Payer: Federal, State, Local not specified - PPO | Admitting: Internal Medicine

## 2018-02-28 DIAGNOSIS — E039 Hypothyroidism, unspecified: Secondary | ICD-10-CM | POA: Insufficient documentation

## 2018-02-28 DIAGNOSIS — R51 Headache: Secondary | ICD-10-CM | POA: Diagnosis not present

## 2018-02-28 DIAGNOSIS — M5481 Occipital neuralgia: Secondary | ICD-10-CM

## 2018-02-28 DIAGNOSIS — Z79899 Other long term (current) drug therapy: Secondary | ICD-10-CM | POA: Insufficient documentation

## 2018-02-28 DIAGNOSIS — M542 Cervicalgia: Secondary | ICD-10-CM

## 2018-02-28 DIAGNOSIS — R519 Headache, unspecified: Secondary | ICD-10-CM

## 2018-02-28 DIAGNOSIS — M436 Torticollis: Secondary | ICD-10-CM | POA: Diagnosis not present

## 2018-02-28 LAB — CBC WITH DIFFERENTIAL/PLATELET
Abs Immature Granulocytes: 0.03 10*3/uL (ref 0.00–0.07)
Basophils Absolute: 0.1 10*3/uL (ref 0.0–0.1)
Basophils Relative: 1 %
EOS PCT: 4 %
Eosinophils Absolute: 0.3 10*3/uL (ref 0.0–0.5)
HEMATOCRIT: 36.1 % (ref 36.0–46.0)
HEMOGLOBIN: 12 g/dL (ref 12.0–15.0)
Immature Granulocytes: 0 %
LYMPHS ABS: 2.3 10*3/uL (ref 0.7–4.0)
LYMPHS PCT: 33 %
MCH: 29.3 pg (ref 26.0–34.0)
MCHC: 33.2 g/dL (ref 30.0–36.0)
MCV: 88 fL (ref 80.0–100.0)
MONO ABS: 0.9 10*3/uL (ref 0.1–1.0)
Monocytes Relative: 13 %
NEUTROS ABS: 3.3 10*3/uL (ref 1.7–7.7)
Neutrophils Relative %: 49 %
Platelets: 385 10*3/uL (ref 150–400)
RBC: 4.1 MIL/uL (ref 3.87–5.11)
RDW: 15.5 % (ref 11.5–15.5)
WBC: 6.9 10*3/uL (ref 4.0–10.5)
nRBC: 0 % (ref 0.0–0.2)

## 2018-02-28 LAB — COMPREHENSIVE METABOLIC PANEL
ALBUMIN: 3.2 g/dL — AB (ref 3.5–5.0)
ALK PHOS: 400 U/L — AB (ref 38–126)
ALT: 83 U/L — AB (ref 0–44)
AST: 93 U/L — ABNORMAL HIGH (ref 15–41)
Anion gap: 8 (ref 5–15)
BILIRUBIN TOTAL: 1.1 mg/dL (ref 0.3–1.2)
BUN: 11 mg/dL (ref 6–20)
CALCIUM: 9 mg/dL (ref 8.9–10.3)
CO2: 24 mmol/L (ref 22–32)
CREATININE: 0.59 mg/dL (ref 0.44–1.00)
Chloride: 105 mmol/L (ref 98–111)
GFR calc non Af Amer: >60 mL/min (ref >60)
GLUCOSE: 90 mg/dL (ref 70–99)
Potassium: 3.8 mmol/L (ref 3.5–5.1)
SODIUM: 137 mmol/L (ref 135–145)
TOTAL PROTEIN: 7.8 g/dL (ref 6.5–8.1)

## 2018-02-28 LAB — SEDIMENTATION RATE: Sed Rate: 31 mm/hr — ABNORMAL HIGH (ref 0–22)

## 2018-02-28 MED ORDER — LIDOCAINE HCL (PF) 1 % IJ SOLN
3.0000 mL | Freq: Once | INTRAMUSCULAR | Status: AC
  Administered 2018-02-28: 3 mL via INTRADERMAL

## 2018-02-28 MED ORDER — IOPAMIDOL (ISOVUE-370) INJECTION 76%
100.0000 mL | Freq: Once | INTRAVENOUS | Status: AC | PRN
  Administered 2018-02-28: 100 mL via INTRAVENOUS

## 2018-02-28 MED ORDER — IOPAMIDOL (ISOVUE-370) INJECTION 76%
INTRAVENOUS | Status: AC
  Filled 2018-02-28: qty 100

## 2018-02-28 MED ORDER — FENTANYL CITRATE (PF) 100 MCG/2ML IJ SOLN
50.0000 ug | Freq: Once | INTRAMUSCULAR | Status: AC
  Administered 2018-02-28: 50 ug via INTRAVENOUS
  Filled 2018-02-28: qty 2

## 2018-02-28 MED ORDER — VALACYCLOVIR HCL 1 G PO TABS: 1000.0000 mg | ORAL_TABLET | Freq: Three times a day (TID) | ORAL | 0 refills | Status: DC

## 2018-02-28 MED ORDER — METHYLPREDNISOLONE ACETATE 40 MG/ML IJ SUSP
40.0000 mg | Freq: Once | INTRAMUSCULAR | Status: AC
  Administered 2018-02-28: 40 mg via INTRAMUSCULAR

## 2018-02-28 MED ORDER — SODIUM CHLORIDE (PF) 0.9 % IJ SOLN
INTRAMUSCULAR | Status: AC
  Filled 2018-02-28: qty 50

## 2018-02-28 MED ORDER — ONDANSETRON HCL 4 MG/2ML IJ SOLN
4.0000 mg | Freq: Once | INTRAMUSCULAR | Status: AC
  Administered 2018-02-28: 4 mg via INTRAVENOUS
  Filled 2018-02-28: qty 2

## 2018-02-28 MED ORDER — SODIUM CHLORIDE 0.9 % IV BOLUS
500.0000 mL | Freq: Once | INTRAVENOUS | Status: AC
  Administered 2018-02-28: 500 mL via INTRAVENOUS

## 2018-02-28 MED ORDER — PREDNISONE 10 MG PO TABS: ORAL_TABLET | ORAL | 0 refills | Status: DC

## 2018-02-28 MED ORDER — HYDROCODONE-ACETAMINOPHEN 5-325 MG PO TABS: 1.0000 | ORAL_TABLET | ORAL | 0 refills | Status: DC | PRN

## 2018-02-28 MED ORDER — MORPHINE SULFATE (PF) 4 MG/ML IV SOLN
4.0000 mg | Freq: Once | INTRAVENOUS | Status: AC
  Administered 2018-02-28: 4 mg via INTRAVENOUS
  Filled 2018-02-28: qty 1

## 2018-02-28 MED ORDER — DIAZEPAM 5 MG PO TABS: ORAL_TABLET | ORAL | 0 refills | Status: DC

## 2018-02-28 MED ORDER — KETOROLAC TROMETHAMINE 60 MG/2ML IM SOLN
60.0000 mg | Freq: Once | INTRAMUSCULAR | Status: AC
  Administered 2018-02-28: 60 mg via INTRAMUSCULAR

## 2018-02-28 NOTE — Assessment & Plan Note (Signed)
S/p 2 ER visits CT head/neck ok Cont Prednisone, Norco Rice bag  Occip nerve block Trigger point inj

## 2018-02-28 NOTE — ED Provider Notes (Signed)
Pt's labs returned.  Pt has elevated LFt's and elevated sed rate.  No temporal tenderness.  Pt has had some relief with pain medications.  Pt has tenderness posterior neck and bilat neck.  I suspect torticollis.  Pt give rx for prednisone, hydrocodone and valium.   She is advised to see Dr. Posey ReaPlotnikov for recheck    Osie CheeksSofia, Latron Ribas K, PA-C 02/28/18 0840    Arby BarrettePfeiffer, Marcy, MD 02/28/18 1538

## 2018-02-28 NOTE — Patient Instructions (Signed)
Occipital Neuralgia    Occipital neuralgia is a type of headache that causes brief episodes of very bad pain in the back of your head. Pain from occipital neuralgia may spread (radiate) to other parts of your head.  These headaches may be caused by irritation of the nerves that leave your spinal cord high up in your neck, just below the base of your skull (occipital nerves). Your occipital nerves transmit sensations from the back of your head, the top of your head, and the areas behind your ears.  What are the causes?  This condition can occur without any known cause (primary headache syndrome). In other cases, this condition is caused by pressure on or irritation of one of the two occipital nerves. Pressure and irritation may be due to:   Muscle spasm in the neck.   Neck injury.   Wear and tear of the vertebrae in the neck (osteoarthritis).   Disease of the disks that separate the vertebrae.   Swollen blood vessels that put pressure on the occipital nerves.   Infections.   Tumors.   Diabetes.  What are the signs or symptoms?  This condition causes brief burning, stabbing, electric, shocking, or shooting pain which can radiate to the top of the head. It can happen on one side or both sides of the head. It can also cause:   Pain behind the eye.   Pain triggered by neck movement or hair brushing.   Scalp tenderness.   Aching in the back of the head between episodes of very bad pain.   Pain gets worse with exposure to bright lights.  How is this diagnosed?  There is no test that diagnoses this condition. Your health care provider may diagnose this condition based on a physical exam and your symptoms. Other tests may be done, such as:   Imaging studies of the brain and neck (cervical spine), such as an MRI or CT scan. These look for causes of pinched nerves.   Applying pressure to the nerves in the neck to try to re-create the pain.   Injection of numbing medicine into the occipital nerve areas to see if  pain goes away (diagnostic nerve block).  How is this treated?  Treatment for this condition may begin with simple measures, such as:   Rest.   Massage.   Applying heat or cold on the area.   Over-the-counter pain relievers.  If these measures do not work, you may need other treatments, including:   Medicines, such as:  ? Prescription-strength anti-inflammatory medicines.  ? Muscle relaxants.  ? Anti-seizure medicines, which can relieve pain.  ? Antidepressants, which can relieve pain.  ? Injected medicines, such as medicines that numb the area (local anesthetic) and steroids.   Pulsed radiofrequency ablation. This is when wires are implanted to deliver electrical impulses that block pain signals from the occipital nerve.   Surgery to relieve nerve pressure.   Physical therapy.  Follow these instructions at home:  Pain management          Avoid any activities that cause pain.   Rest when you have an attack of pain.   Try gentle massage to relieve pain.   Try a different pillow or sleeping position.   If directed, apply heat to the affected area as told by your health care provider. Use the heat source that your health care provider recommends, such as a moist heat pack or a heating pad.  ? Place a towel between your skin   and the heat source.  ? Leave the heat on for 20-30 minutes.  ? Remove the heat if your skin turns bright red. This is especially important if you are unable to feel pain, heat, or cold. You may have a greater risk of getting burned.   If directed, apply ice to the back of the head and neck area as told by your health care provider.  ? Put ice in a plastic bag.  ? Place a towel between your skin and the bag.  ? Leave the ice on for 20 minutes, 2-3 times per day.  General instructions   Take over-the-counter and prescription medicines only as told by your health care provider.   Avoid things that make your symptoms worse, such as bright lights.   Try to stay active. Get regular  exercise that does not cause pain. Ask your health care provider to suggest safe exercises for you.   Work with a physical therapist to learn stretching exercises you can do at home.   Practice good posture.   Keep all follow-up visits as told by your health care provider. This is important.  Contact a health care provider if:   Your medicine is not working.   You have new or worsening symptoms.  Get help right away if:   You have very bad head pain that does not go away.   You have a sudden change in vision, balance, or speech.  Summary   Occipital neuralgia is a type of headache that causes brief episodes of very bad pain in the back of your head.   Pain from occipital neuralgia may spread (radiate) to other parts of your head.   Treatment for this condition includes rest, massage, and medicines.  This information is not intended to replace advice given to you by your health care provider. Make sure you discuss any questions you have with your health care provider.  Document Released: 02/17/2001 Document Revised: 02/09/2017 Document Reviewed: 04/30/2016  [employer] Interactive Patient Education  2019 [employer]

## 2018-02-28 NOTE — ED Provider Notes (Signed)
[facility]-EMERGENCY DEPT Provider Note   CSN: [id number] Arrival date & time: 02/28/18  0229     History   Chief Complaint Chief Complaint  Patient presents with  . Neck Pain    HPI Margaret Preston is a 57 y.o. female with a hx of pression, GERD, hypothyroidism, pancreatitis presents to the Emergency Department complaining of gradual, persistent, progressively worsening left-sided headache onset 2 days ago with associated sided neck pain.  Pain radiates up into the left side of her scalp and down into her left shoulder.  She reports significant worsening of pain with movement of her shoulder and neck however no significant increase with palpation.  She does not see a Landchiropractor.  She denies MVA or falls.  No known trauma.  Patient reports temporal pain as well but denies vision changes.  No numbness, tingling, weakness, syncope.  Patient reports that she was evaluated in the emergency room 2 nights ago.  She states the medications given there helped the pain some but it did not resolve.  She was given muscle relaxers for use at home and reports using these along with warm compresses without relief.  Patient reports the pain has become unbearable.  She states mild nausea but no vomiting.  No photophobia or phonophobia.  She does not have a history of migraines and does not routinely get headaches.  She denies difficulty walking or talking.  Nothing she has tried at home helps.  Nothing makes her pain better.  The history is provided by the patient, a parent and medical records. No language interpreter was used.    Past Medical History:  Diagnosis Date  . Depression   . GERD (gastroesophageal reflux disease)   . Hypothyroidism 2009  . Insomnia   . Obesity   . Pancreatitis 2011  . Vitamin B12 deficiency 2009  . Vitamin D deficiency     Patient Active Problem List   Diagnosis Date Noted  . Liver cirrhosis (HCC) 10/19/2017  . RUQ pain 10/19/2017  .  Elevated liver enzymes 01/12/2017  . Urinary frequency 12/03/2016  . Abnormal CT scan, head 11/03/2016  . Mild concussion 06/02/2016  . Benign paroxysmal positional vertigo 10/08/2015  . Headache 10/08/2015  . Ataxia 10/08/2015  . 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 Well adult exam 03/01/2013  . Obesity, morbid (HCC) 05/18/2012  . Anxiety state 05/26/2010  . UPPER RESPIRATORY INFECTION, ACUTE 05/26/2010  . MENORRHAGIA 05/26/2010  . Adult acne 07/09/2009  . GERD 07/01/2009  . PANCREATITIS, HX OF 07/01/2009  . MUSCULOSKELETAL PAIN 03/28/2009  . CHEST PAIN UNSPECIFIED 03/28/2009  . Vitamin D deficiency 06/20/2007  . Hypothyroidism 06/15/2007  . B12 deficiency 06/14/2007  . Depression 06/14/2007  . FATIGUE 06/14/2007  . PARESTHESIA 06/14/2007  . Abnormal weight gain 06/14/2007    Past Surgical History:  Procedure Laterality Date  . APPENDECTOMY    . CESAREAN SECTION    . CHOLECYSTECTOMY    . LAPAROSCOPIC GASTRIC SLEEVE RESECTION  09/05/2014     OB History   No obstetric history on file.      Home Medications    Prior to Admission medications   Medication Sig Start Date End Date Taking? Authorizing Provider  cholecalciferol (VITAMIN D) 1000 UNITS tablet Take 1 tablet (1,000 Units total) by mouth daily. 06/21/13  Yes Plotnikov, Georgina QuintAleksei V, MD  cyanocobalamin 1000 MCG tablet Take 0.5 tablets (500 mcg total) by mouth daily. 06/21/13  Yes Plotnikov, Georgina QuintAleksei V, MD  cyclobenzaprine (FLEXERIL) 10 MG tablet  Take 1 tablet (10 mg total) by mouth 2 (two) times daily as needed for muscle spasms. 02/27/18  Yes Roxy HorsemanBrowning, Robert, PA-C  Dexlansoprazole (DEXILANT) 30 MG capsule Take 1 capsule (30 mg total) by mouth daily. 11/09/17 02/28/18 Yes Plotnikov, Georgina QuintAleksei V, MD  FLUoxetine (PROZAC) 40 MG capsule Take 1 capsule (40 mg total) by mouth daily. 10/29/17  Yes Plotnikov, Georgina QuintAleksei V, MD  LORazepam (ATIVAN) 2 MG tablet TAKE 1/2 TO 1 TABLET BY MOUTH TWICE A DAY AS NEEDED Patient  taking differently: Take 1-2 mg by mouth 2 (two) times daily as needed for anxiety.  06/17/17  Yes Plotnikov, Georgina QuintAleksei V, MD  thiamine (VITAMIN B-1) 100 MG tablet Take 1 tablet (100 mg total) by mouth daily. 10/31/15  Yes Plotnikov, Georgina QuintAleksei V, MD  tretinoin (RETIN-A) 0.025 % cream APPLY TOPICALLY AT BEDTIME. ON FACE Patient taking differently: Apply 1 application topically at bedtime.  04/06/17  Yes Plotnikov, Georgina QuintAleksei V, MD  ibuprofen (ADVIL,MOTRIN) 800 MG tablet Take 1 tablet (800 mg total) by mouth 3 (three) times daily. Patient not taking: Reported on 02/28/2018 02/27/18   Roxy HorsemanBrowning, Robert, PA-C  ranitidine (ZANTAC) 150 MG tablet Take 1 tablet (150 mg total) by mouth 2 (two) times daily. Patient not taking: Reported on 02/28/2018 11/09/17 11/09/18  Plotnikov, Georgina QuintAleksei V, MD  Vitamin D, Ergocalciferol, (DRISDOL) 50000 units CAPS capsule TAKE 1 CAPSULE (50,000 UNITS TOTAL) BY MOUTH ONCE A WEEK. Patient not taking: Reported on 02/28/2018 11/17/16   Plotnikov, Georgina QuintAleksei V, MD    Family History Family History  Problem Relation Age of Onset  . Heart disease Mother        pacemaker  . Hypertension Other     Social History Social History   Tobacco Use  . Smoking status: Never Smoker  . Smokeless tobacco: Never Used  Substance Use Topics  . Alcohol use: Yes  . Drug use: No     Allergies   Trazodone and nefazodone   Review of Systems Review of Systems  Constitutional: Negative for appetite change, diaphoresis, fatigue, fever and unexpected weight change.  HENT: Negative for mouth sores.   Eyes: Negative for visual disturbance.  Respiratory: Negative for cough, chest tightness, shortness of breath and wheezing.   Cardiovascular: Negative for chest pain.  Gastrointestinal: Negative for abdominal pain, constipation, diarrhea, nausea and vomiting.  Endocrine: Negative for polydipsia, polyphagia and polyuria.  Genitourinary: Negative for dysuria, frequency, hematuria and urgency.    Musculoskeletal: Positive for neck pain. Negative for back pain and neck stiffness.  Skin: Negative for rash.  Allergic/Immunologic: Negative for immunocompromised state.  Neurological: Negative for syncope, light-headedness and headaches.  Hematological: Does not bruise/bleed easily.  Psychiatric/Behavioral: Negative for sleep disturbance. The patient is not nervous/anxious.      Physical Exam Updated Vital Signs BP 112/82   Pulse 62   Temp 98.2 F (36.8 C) (Oral)   Resp 17   Ht 5\' 6"  (1.676 m)   Wt 88.5 kg   LMP 05/08/2014   SpO2 100%   BMI 31.47 kg/m   Physical Exam Vitals signs and nursing note reviewed.  Constitutional:      General: She is not in acute distress.    Appearance: She is well-developed. She is not diaphoretic.  HENT:     Head: Normocephalic and atraumatic.     Jaw: No trismus.     Comments: Mild tenderness to palpation along the left temporal artery but no bruit heard.    Mouth/Throat:     Pharynx: Oropharynx is  clear. No pharyngeal swelling, oropharyngeal exudate, posterior oropharyngeal erythema or uvula swelling.  Eyes:     General: No scleral icterus.    Extraocular Movements: Extraocular movements intact.     Conjunctiva/sclera: Conjunctivae normal.     Pupils: Pupils are equal, round, and reactive to light.     Comments: No horizontal, vertical or rotational nystagmus  Neck:     Musculoskeletal: Decreased range of motion. Pain with movement and muscular tenderness present. No edema, erythema or spinous process tenderness.     Vascular: No carotid bruit or JVD.     Trachea: Trachea normal. No tracheal tenderness.     Comments: Significantly decreased range of motion due to pain.  No midline tenderness but left-sided paraspinal tenderness.  No rash, erythema.  No cervical adenopathy. Normal phonation, handling secretions without difficulty. Cardiovascular:     Rate and Rhythm: Normal rate and regular rhythm.     Pulses:          Radial pulses  are 2+ on the right side and 2+ on the left side.       Dorsalis pedis pulses are 2+ on the right side and 2+ on the left side.  Pulmonary:     Effort: Pulmonary effort is normal. No respiratory distress.     Breath sounds: Normal breath sounds. No wheezing or rales.  Abdominal:     General: Bowel sounds are normal.     Palpations: Abdomen is soft.     Tenderness: There is no abdominal tenderness. There is no guarding.  Lymphadenopathy:     Cervical: No cervical adenopathy.  Skin:    General: Skin is warm and dry.     Findings: No rash.     Comments: No rash noted.  Neurological:     Mental Status: She is alert and oriented to person, place, and time.     Cranial Nerves: No cranial nerve deficit.     Motor: No abnormal muscle tone.     Coordination: Coordination normal.     Comments: Mental Status:  Alert, oriented, thought content appropriate. Speech fluent without evidence of aphasia. Able to follow 2 step commands without difficulty.  Cranial Nerves:  II:  Peripheral visual fields grossly normal, pupils equal, round, reactive to light III,IV, VI: ptosis not present, extra-ocular motions intact bilaterally  V,VII: smile symmetric, facial light touch sensation equal VIII: hearing grossly normal bilaterally  IX,X: midline uvula rise  XI: bilateral shoulder shrug equal and strong XII: midline tongue extension  Motor:  5/5 in upper and lower extremities bilaterally including strong and equal grip strength and dorsiflexion/plantar flexion Sensory: Pinprick and light touch normal in all extremities.  Cerebellar: normal finger-to-nose with bilateral upper extremities Gait: gait testing deferred CV: distal pulses palpable throughout   Psychiatric:        Behavior: Behavior normal.        Thought Content: Thought content normal.        Judgment: Judgment normal.      ED Treatments / Results  Labs (all labs ordered are listed, but only abnormal results are displayed) Labs  Reviewed  COMPREHENSIVE METABOLIC PANEL - Abnormal; Notable for the following components:      Result Value   Albumin 3.2 (*)    AST 93 (*)    ALT 83 (*)    Alkaline Phosphatase 400 (*)    All other components within normal limits  SEDIMENTATION RATE - Abnormal; Notable for the following components:   Sed Rate 31 (*)  All other components within normal limits  CBC WITH DIFFERENTIAL/PLATELET    Procedures Procedures (including critical care time)  Medications Ordered in ED Medications  iopamidol (ISOVUE-370) 76 % injection 100 mL (has no administration in time range)  sodium chloride (PF) 0.9 % injection (has no administration in time range)  iopamidol (ISOVUE-370) 76 % injection (has no administration in time range)  fentaNYL (SUBLIMAZE) injection 50 mcg (50 mcg Intravenous Given 02/28/18 0454)  sodium chloride 0.9 % bolus 500 mL (500 mLs Intravenous New Bag/Given 02/28/18 0458)     Initial Impression / Assessment and Plan / ED Course  I have reviewed the triage vital signs and the nursing notes.  Pertinent labs & imaging results that were available during my care of the patient were reviewed by me and considered in my medical decision making (see chart for details).     Patient presents with headache and neck pain.  No treatments over the last 2 days have helped at all.  She does have limited range of motion however pain is not specifically reproducible.  No carotid bruits.  No evidence of shingles.  Less likely to be temporal arteritis however will check labs.  Concern for possible vertebral dissection.  Will obtain CTA of the head and neck.  Less likely to be subarachnoid or subdural hemorrhage.  6:30 AM At shift change care was transferred to Langston Masker, PA-C who will follow pending studies, re-evaulate and determine disposition.    Final Clinical Impressions(s) / ED Diagnoses   Final diagnoses:  Neck pain  Left-sided headache    ED Discharge Orders    None        Mardene Sayer Boyd Kerbs 02/28/18 0631    Molpus, Jonny Ruiz, MD 02/28/18 [phone]

## 2018-02-28 NOTE — Progress Notes (Signed)
Subjective:  Patient ID: Margaret Preston, female    DOB: 1960/07/13  Age: 57 y.o. MRN: [id number]  CC: Follow-up (ER Follow-up- pt states she still having pain in her neck. went to ED last night)   HPI Margaret Preston presents for L neck pain and L posterior HA since Thursday - severe constant 11 out of 10. C/o nausea. She went to ER twice  Outpatient Medications Prior to Visit  Medication Sig Dispense Refill  . cholecalciferol (VITAMIN D) 1000 UNITS tablet Take 1 tablet (1,000 Units total) by mouth daily. 100 tablet 3  . cyanocobalamin 1000 MCG tablet Take 0.5 tablets (500 mcg total) by mouth daily. 100 tablet 3  . cyclobenzaprine (FLEXERIL) 10 MG tablet Take 1 tablet (10 mg total) by mouth 2 (two) times daily as needed for muscle spasms. 20 tablet 0  . Dexlansoprazole (DEXILANT) 30 MG capsule Take 1 capsule (30 mg total) by mouth daily. 90 capsule 1  . diazepam (VALIUM) 5 MG tablet One tablet every 8 hours for muscle spasm 12 tablet 0  . FLUoxetine (PROZAC) 40 MG capsule Take 1 capsule (40 mg total) by mouth daily. 90 capsule 1  . HYDROcodone-acetaminophen (NORCO/VICODIN) 5-325 MG tablet Take 1 tablet by mouth every 4 (four) hours as needed. 12 tablet 0  . LORazepam (ATIVAN) 2 MG tablet TAKE 1/2 TO 1 TABLET BY MOUTH TWICE A DAY AS NEEDED (Patient taking differently: Take 1-2 mg by mouth 2 (two) times daily as needed for anxiety. ) 180 tablet 1  . thiamine (VITAMIN B-1) 100 MG tablet Take 1 tablet (100 mg total) by mouth daily. 100 tablet 1  . tretinoin (RETIN-A) 0.025 % cream APPLY TOPICALLY AT BEDTIME. ON FACE (Patient taking differently: Apply 1 application topically at bedtime. ) 45 g 3  . predniSONE (DELTASONE) 10 MG tablet 6,5,4,3,2,1 taper (Patient not taking: Reported on 02/28/2018) 15 tablet 0   No facility-administered medications prior to visit.     ROS: Review of Systems  Constitutional: Positive for fatigue. Negative for activity change, appetite change, chills, fever  and unexpected weight change.  HENT: Negative for congestion, mouth sores and sinus pressure.   Eyes: Negative for visual disturbance.  Respiratory: Negative for cough and chest tightness.   Gastrointestinal: Positive for nausea. Negative for abdominal pain.  Genitourinary: Negative for difficulty urinating, frequency and vaginal pain.  Musculoskeletal: Positive for neck pain and neck stiffness. Negative for back pain and gait problem.  Skin: Negative for pallor and rash.  Neurological: Positive for headaches. Negative for dizziness, tremors, weakness and numbness.  Psychiatric/Behavioral: Negative for confusion, sleep disturbance and suicidal ideas.    Objective:  BP 120/82 (BP Location: Left Arm, Patient Position: Sitting, Cuff Size: Normal)   Pulse 68   Temp 97.6 F (36.4 C) (Oral)   Wt 202 lb 12.8 oz (92 kg)   LMP 05/08/2014   BMI 32.73 kg/m   BP Readings from Last 3 Encounters:  02/28/18 120/82  02/28/18 (!) 150/95  02/27/18 100/65    Wt Readings from Last 3 Encounters:  02/28/18 202 lb 12.8 oz (92 kg)  02/28/18 195 lb (88.5 kg)  02/26/18 195 lb (88.5 kg)    Physical Exam Constitutional:      General: She is not in acute distress.    Appearance: She is well-developed.  HENT:     Head: Normocephalic.     Right Ear: External ear normal.     Left Ear: External ear normal.     Nose: Nose normal.  Eyes:     General:        Right eye: No discharge.        Left eye: No discharge.     Conjunctiva/sclera: Conjunctivae normal.     Pupils: Pupils are equal, round, and reactive to light.  Neck:     Musculoskeletal: Normal range of motion and neck supple.     Thyroid: No thyromegaly.     Vascular: No JVD.     Trachea: No tracheal deviation.  Cardiovascular:     Rate and Rhythm: Normal rate and regular rhythm.     Heart sounds: Normal heart sounds.  Pulmonary:     Effort: No respiratory distress.     Breath sounds: No stridor. No wheezing.  Abdominal:     General:  Bowel sounds are normal. There is no distension.     Palpations: Abdomen is soft. There is no mass.     Tenderness: There is no abdominal tenderness. There is no guarding or rebound.  Musculoskeletal:        General: Tenderness present.  Lymphadenopathy:     Cervical: No cervical adenopathy.  Skin:    Findings: No erythema or rash.  Neurological:     Cranial Nerves: No cranial nerve deficit.     Motor: No weakness or abnormal muscle tone.     Coordination: Coordination normal.     Gait: Gait normal.     Deep Tendon Reflexes: Reflexes normal.  Psychiatric:        Behavior: Behavior normal.        Thought Content: Thought content normal.        Judgment: Judgment normal.   L occip nerve exit area and L lat scalp - very tender B traps tender L>>R   Procedure Note :     occipital nerve Injection:   Indication :  L occipital neuralgia.   Risks including unsuccessful procedure , bleeding, infection, bruising, skin atrophy and others were explained to the patient in detail as well as the benefits. Informed consent was obtained and signed.   Tthe patient was placed in a comfortable position.  L occip nerve exit point was marked and  the skin was prepped with Betadine and alcohol. 11/2 inch 25-gauge needle was used. The needle was advanced perpendicular to the skin and I injected the site with 2 mL of 2% lidocaine and 20 mg of Depo-Medrol in a usual fashion.     Tolerated well. Complications: None. Good pain relief following the procedure.    Procedure Note :    Trigger Point Injection:   Indication : Focal tender area identifiable by the location without other identifiable neurologic or musculoskeletal finding or pathology.   Risks including unsuccessful procedure , bleeding, infection, bruising, skin atrophy and others were explained to the patient in detail as well as the benefits. Informed consent was obtained and signed.   Tthe patient was placed in a comfortable position.  2   points of maximum tenderness over paraspinal and trapezius muscles were marked and  the skin was prepped with Betadine and alcohol on B traps. 1 inch 25-gauge needle was used. The needle was advanced perpendicular to the skin. Each trigger point was injected with 1 mL of 2% lidocaine and 10 mg of Depo-Medrol in a usual fashion.  Band-Aids applied.   Tolerated well. Complications: None. Good pain relief following the procedure.   Lab Results  Component Value Date   WBC 6.9 02/28/2018   HGB 12.0 02/28/2018  HCT 36.1 02/28/2018   PLT 385 02/28/2018   GLUCOSE 90 02/28/2018   CHOL 138 11/03/2016   TRIG 55.0 11/03/2016   HDL 62.60 11/03/2016   LDLCALC 64 11/03/2016   ALT 83 (H) 02/28/2018   AST 93 (H) 02/28/2018   NA 137 02/28/2018   K 3.8 02/28/2018   CL 105 02/28/2018   CREATININE 0.59 02/28/2018   BUN 11 02/28/2018   CO2 24 02/28/2018   TSH 2.26 04/26/2017   INR 1.0 06/09/2017   HGBA1C 5.9 02/28/2013    Ct Angio Head W Or Wo Contrast  Result Date: 02/28/2018 CLINICAL DATA:  Headache, acute, severe, worst headache of life EXAM: CT ANGIOGRAPHY HEAD AND NECK TECHNIQUE: Multidetector CT imaging of the head and neck was performed using the standard protocol during bolus administration of intravenous contrast. Multiplanar CT image reconstructions and MIPs were obtained to evaluate the vascular anatomy. Carotid stenosis measurements (when applicable) are obtained utilizing NASCET criteria, using the distal internal carotid diameter as the denominator. CONTRAST:  ISOVUE-370 IOPAMIDOL (ISOVUE-370) INJECTION 76% COMPARISON:  CT head without contrast 05/29/2016. MRI brain 11/23/2016 FINDINGS: CT HEAD FINDINGS Brain: Noncontrast imaging the head is unremarkable. No acute infarct, hemorrhage, or mass lesion is present. Basal ganglia are intact. No significant white matter disease is present. The ventricles are of normal size. No significant extraaxial fluid collection is present. The brainstem  and cerebellum are normal. Vascular: No hyperdense vessel or unexpected calcification. Skull: Calvarium is intact. No focal lytic or blastic lesions are present. Sinuses: The paranasal sinuses and mastoid air cells are clear. Orbits: Globes and orbits are within normal limits. Review of the MIP images confirms the above findings CTA NECK FINDINGS Aortic arch: There is a common origin of the left common carotid artery and innominate artery. Minimal atherosclerotic calcifications are present at the aortic arch without aneurysm. There is no stenosis at the great vessel origins. Right carotid system: The right common carotid artery is tortuous. Bifurcation is unremarkable. There is mild tortuosity of the cervical right ICA without significant stenosis. Left carotid system: The left common carotid artery is within normal limits. Bifurcation is unremarkable. Cervical left ICA is tortuous without focal stenosis. Vertebral arteries: The vertebral arteries are codominant. Both vertebral arteries originate from the subclavian arteries without significant stenosis at the origins. There is no focal stenosis of either vertebral artery in the neck. No vascular injury is evident. Skeleton: Mild endplate degenerative changes are present at C5-6 and C6-7. Vertebral body heights and alignment are maintained. No focal lytic or blastic lesions are present. Other neck: The soft tissues the neck are otherwise unremarkable. No focal mucosal or submucosal lesions are present. Salivary glands are within normal limits. No significant cervical adenopathy is present. Upper chest: The lung apices are clear. Thoracic inlet is within normal. Review of the MIP images confirms the above findings CTA HEAD FINDINGS Anterior circulation: The internal carotid arteries are within normal limits from the high cervical segments through the ICA termini. The A1 and M1 segments are normal. The anterior communicating artery is patent. MCA bifurcations are  intact. ACA and MCA branch vessels are within normal limits. No significant proximal stenosis or branch vessel occlusion is present. There is no significant aneurysm. Posterior circulation: The vertebral arteries are codominant. PICA origins are visualized and normal. Vertebrobasilar junction is normal. The basilar artery is normal. The left posterior cerebral artery originates from basilar tip. The right posterior cerebral artery is of fetal type. PCA branch vessels are normal bilaterally.  There is no aneurysm. Venous sinuses: Dural sinuses are patent. Straight sinus and deep cerebral veins are intact. Cortical veins are unremarkable. Anatomic variants: Fetal type right posterior cerebral artery. Delayed phase: Delayed images demonstrate no pathologic enhancement. Review of the MIP images confirms the above findings IMPRESSION: 1. No acute or focal lesion to explain neck pain or headaches. 2. No subarachnoid hemorrhage or aneurysm. 3. Tortuosity of the cervical internal carotid arteries bilaterally without significant stenosis. This is nonspecific, but most commonly seen in the setting of chronic hypertension. 4. Normal variant CTA circle-of-Willis without significant proximal stenosis, aneurysm, or branch vessel occlusion. Electronically Signed   By: Marin Robertshristopher  Mattern M.D.   On: 02/28/2018 07:44   Ct Angio Neck W And/or Wo Contrast  Result Date: 02/28/2018 CLINICAL DATA:  Headache, acute, severe, worst headache of life EXAM: CT ANGIOGRAPHY HEAD AND NECK TECHNIQUE: Multidetector CT imaging of the head and neck was performed using the standard protocol during bolus administration of intravenous contrast. Multiplanar CT image reconstructions and MIPs were obtained to evaluate the vascular anatomy. Carotid stenosis measurements (when applicable) are obtained utilizing NASCET criteria, using the distal internal carotid diameter as the denominator. CONTRAST:  100mL ISOVUE-370 IOPAMIDOL (ISOVUE-370) INJECTION 76%  COMPARISON:  CT head without contrast 05/29/2016. MRI brain 11/23/2016 FINDINGS: CT HEAD FINDINGS Brain: Noncontrast imaging the head is unremarkable. No acute infarct, hemorrhage, or mass lesion is present. Basal ganglia are intact. No significant white matter disease is present. The ventricles are of normal size. No significant extraaxial fluid collection is present. The brainstem and cerebellum are normal. Vascular: No hyperdense vessel or unexpected calcification. Skull: Calvarium is intact. No focal lytic or blastic lesions are present. Sinuses: The paranasal sinuses and mastoid air cells are clear. Orbits: Globes and orbits are within normal limits. Review of the MIP images confirms the above findings CTA NECK FINDINGS Aortic arch: There is a common origin of the left common carotid artery and innominate artery. Minimal atherosclerotic calcifications are present at the aortic arch without aneurysm. There is no stenosis at the great vessel origins. Right carotid system: The right common carotid artery is tortuous. Bifurcation is unremarkable. There is mild tortuosity of the cervical right ICA without significant stenosis. Left carotid system: The left common carotid artery is within normal limits. Bifurcation is unremarkable. Cervical left ICA is tortuous without focal stenosis. Vertebral arteries: The vertebral arteries are codominant. Both vertebral arteries originate from the subclavian arteries without significant stenosis at the origins. There is no focal stenosis of either vertebral artery in the neck. No vascular injury is evident. Skeleton: Mild endplate degenerative changes are present at C5-6 and C6-7. Vertebral body heights and alignment are maintained. No focal lytic or blastic lesions are present. Other neck: The soft tissues the neck are otherwise unremarkable. No focal mucosal or submucosal lesions are present. Salivary glands are within normal limits. No significant cervical adenopathy is  present. Upper chest: The lung apices are clear. Thoracic inlet is within normal. Review of the MIP images confirms the above findings CTA HEAD FINDINGS Anterior circulation: The internal carotid arteries are within normal limits from the high cervical segments through the ICA termini. The A1 and M1 segments are normal. The anterior communicating artery is patent. MCA bifurcations are intact. ACA and MCA branch vessels are within normal limits. No significant proximal stenosis or branch vessel occlusion is present. There is no significant aneurysm. Posterior circulation: The vertebral arteries are codominant. PICA origins are visualized and normal. Vertebrobasilar junction is normal. The  basilar artery is normal. The left posterior cerebral artery originates from basilar tip. The right posterior cerebral artery is of fetal type. PCA branch vessels are normal bilaterally. There is no aneurysm. Venous sinuses: Dural sinuses are patent. Straight sinus and deep cerebral veins are intact. Cortical veins are unremarkable. Anatomic variants: Fetal type right posterior cerebral artery. Delayed phase: Delayed images demonstrate no pathologic enhancement. Review of the MIP images confirms the above findings IMPRESSION: 1. No acute or focal lesion to explain neck pain or headaches. 2. No subarachnoid hemorrhage or aneurysm. 3. Tortuosity of the cervical internal carotid arteries bilaterally without significant stenosis. This is nonspecific, but most commonly seen in the setting of chronic hypertension. 4. Normal variant CTA circle-of-Willis without significant proximal stenosis, aneurysm, or branch vessel occlusion. Electronically Signed   By: Marin Robertshristopher  Mattern M.D.   On: 02/28/2018 07:44    Assessment & Plan:   There are no diagnoses linked to this encounter.   No orders of the defined types were placed in this encounter.    Follow-up: No follow-ups on file.  Sonda PrimesAlex Plotnikov, MD

## 2018-02-28 NOTE — Assessment & Plan Note (Signed)
Trigger point inj Valtrex if rash Toradol IM

## 2018-03-16 ENCOUNTER — Other Ambulatory Visit: Payer: Self-pay | Admitting: Internal Medicine

## 2018-05-23 ENCOUNTER — Other Ambulatory Visit: Payer: Self-pay

## 2018-05-23 ENCOUNTER — Ambulatory Visit: Payer: Federal, State, Local not specified - PPO | Admitting: Internal Medicine

## 2018-05-23 ENCOUNTER — Ambulatory Visit (INDEPENDENT_AMBULATORY_CARE_PROVIDER_SITE_OTHER)
Admission: RE | Admit: 2018-05-23 | Discharge: 2018-05-23 | Disposition: A | Discharge status: Home / Self Care | Payer: Federal, State, Local not specified - PPO | Source: Ambulatory Visit | Attending: Internal Medicine | Admitting: Internal Medicine

## 2018-05-23 ENCOUNTER — Encounter: Payer: Self-pay | Admitting: Internal Medicine

## 2018-05-23 VITALS — BP 110/80 | HR 59 | Temp 98.0°F | Ht 66.0 in | Wt 202.0 lb

## 2018-05-23 DIAGNOSIS — S42021A Displaced fracture of shaft of right clavicle, initial encounter for closed fracture: Secondary | ICD-10-CM | POA: Diagnosis not present

## 2018-05-23 DIAGNOSIS — M25511 Pain in right shoulder: Secondary | ICD-10-CM

## 2018-05-23 MED ORDER — HYDROCODONE-ACETAMINOPHEN 5-325 MG PO TABS: 1.0000 | ORAL_TABLET | Freq: Four times a day (QID) | ORAL | 0 refills | Status: DC | PRN

## 2018-05-23 MED ORDER — TRAMADOL HCL 50 MG PO TABS: 50.0000 mg | ORAL_TABLET | Freq: Three times a day (TID) | ORAL | 0 refills | Status: DC | PRN

## 2018-05-23 MED ORDER — KETOROLAC TROMETHAMINE 30 MG/ML IJ SOLN
30.0000 mg | Freq: Once | INTRAMUSCULAR | Status: AC
  Administered 2018-05-23: 30 mg via INTRAMUSCULAR

## 2018-05-23 NOTE — Progress Notes (Signed)
   Subjective:   Patient ID: Margaret Preston, female    DOB: 25-Apr-1960, 58 y.o.   MRN: [id number]  HPI The patient is a 58 YO female coming in for right shoulder pain after a fall last night. She was walking up stairs carrying something and missed a step. She fell backwards and then hit her right shoulder and side. She denies hitting head or LOC. Denies headache or confusion today. Extreme pain with moving arm. Can move wrist with arm stabilized without pain. Denies numbness or weakness in the right arm. Pain 10/10. Stable since onset. Tried tylenol which did not help much.   PMH, [facility], social history reviewed and updated  Review of Systems  Constitutional: Negative.   Respiratory: Negative for cough, chest tightness and shortness of breath.   Cardiovascular: Negative for chest pain, palpitations and leg swelling.  Gastrointestinal: Negative for abdominal distention, abdominal pain, constipation, diarrhea, nausea and vomiting.  Musculoskeletal: Positive for arthralgias, joint swelling and myalgias.  Skin: Negative.   Neurological: Negative.   Psychiatric/Behavioral: Negative.     Objective:  Physical Exam Constitutional:      Appearance: She is well-developed.  HENT:     Head: Normocephalic and atraumatic.  Neck:     Musculoskeletal: Normal range of motion.  Cardiovascular:     Rate and Rhythm: Normal rate and regular rhythm.  Pulmonary:     Effort: Pulmonary effort is normal. No respiratory distress.     Breath sounds: Normal breath sounds. No wheezing or rales.  Abdominal:     General: Bowel sounds are normal. There is no distension.     Palpations: Abdomen is soft.     Tenderness: There is no abdominal tenderness. There is no rebound.  Musculoskeletal:        General: Tenderness present.     Comments: Pain right AC and along the right clavicle  Skin:    General: Skin is warm and dry.  Neurological:     Mental Status: She is alert and oriented to person, place, and time.      Coordination: Coordination normal.     Vitals:   05/23/18 1147  BP: 110/80  Pulse: (!) 59  Temp: 98 F (36.7 C)  TempSrc: Oral  SpO2: 99%  Weight: 202 lb (91.6 kg)  Height: 5\' 6"  (1.676 m)    Assessment & Plan:  Toradol 30 mg IM given at visit

## 2018-05-23 NOTE — Assessment & Plan Note (Addendum)
X-ray shoulder and clavicle ordered today. Rx for tramadol for pain. Given toradol 30 mg IM at visit. She was not very happy about tramadol being given and states that it does not ever help her and demands something different. Given her concurrent ativan there is black box warning against hydrocodone and I have recommended to try tramadol as this is the safest option. She is not happy and states she will not fill or try this.

## 2018-05-23 NOTE — Addendum Note (Signed)
Addended by: Hillard Danker A on: 05/23/2018 12:54 PM   Modules accepted: Orders

## 2018-05-23 NOTE — Patient Instructions (Signed)
We have sent in tramadol to use for pain if needed.   We have given you an injection today called toradol to help with pain within 30 minutes.   We are checking the x-ray which should be back today or tomorrow morning at latest.

## 2018-06-02 ENCOUNTER — Ambulatory Visit: Payer: Self-pay | Admitting: *Deleted

## 2018-06-02 NOTE — Telephone Encounter (Signed)
Pt stated she broke her collar bone and she needs to speak with a nurse because she has some questions. Pt requests call back. Cb# [phone]  Patient reports the swelling is going down- no bruising- pain is less. Patient is doing exercises and making good progress. She does report night time is the worst part of her day- she has to use Tylenol for sleep- but otherwise she is progressing better than she thought. Patient is a little concerned about coming to the office in April- I told her that if she did not need to be seen- or if things changed the office would call and notify her. She is good with that plan- thankful for everything office is doing to help her and she is aware to call back if she has any changes.

## 2018-06-06 ENCOUNTER — Ambulatory Visit: Payer: Federal, State, Local not specified - PPO | Admitting: Family Medicine

## 2018-06-06 NOTE — Telephone Encounter (Signed)
Called pt. appt cancelled for today.

## 2018-06-14 ENCOUNTER — Encounter: Payer: Self-pay | Admitting: Internal Medicine

## 2018-06-14 ENCOUNTER — Ambulatory Visit (INDEPENDENT_AMBULATORY_CARE_PROVIDER_SITE_OTHER): Payer: Federal, State, Local not specified - PPO | Admitting: Internal Medicine

## 2018-06-14 DIAGNOSIS — F3341 Major depressive disorder, recurrent, in partial remission: Secondary | ICD-10-CM

## 2018-06-14 DIAGNOSIS — S42034D Nondisplaced fracture of lateral end of right clavicle, subsequent encounter for fracture with routine healing: Secondary | ICD-10-CM

## 2018-06-14 DIAGNOSIS — E538 Deficiency of other specified B group vitamins: Secondary | ICD-10-CM | POA: Diagnosis not present

## 2018-06-14 DIAGNOSIS — E559 Vitamin D deficiency, unspecified: Secondary | ICD-10-CM

## 2018-06-14 DIAGNOSIS — K7460 Unspecified cirrhosis of liver: Secondary | ICD-10-CM

## 2018-06-14 DIAGNOSIS — S42009A Fracture of unspecified part of unspecified clavicle, initial encounter for closed fracture: Secondary | ICD-10-CM | POA: Insufficient documentation

## 2018-06-14 MED ORDER — DEXLANSOPRAZOLE 30 MG PO CPDR: 30.0000 mg | DELAYED_RELEASE_CAPSULE | Freq: Every day | ORAL | 1 refills | Status: DC

## 2018-06-14 MED ORDER — MELOXICAM 7.5 MG PO TABS: 7.5000 mg | ORAL_TABLET | Freq: Every day | ORAL | 0 refills | Status: DC | PRN

## 2018-06-14 NOTE — Assessment & Plan Note (Addendum)
Ice Sling Meloxicam w/Dexilant D/c Tylenol

## 2018-06-14 NOTE — Assessment & Plan Note (Signed)
Discontinue Tylenol; will use Dexilant along with meloxicam pc prn

## 2018-06-14 NOTE — Assessment & Plan Note (Signed)
On B12 

## 2018-06-14 NOTE — Assessment & Plan Note (Signed)
Fluoxetine  

## 2018-06-14 NOTE — Assessment & Plan Note (Signed)
Vit D 

## 2018-06-14 NOTE — Progress Notes (Signed)
Virtual Visit via Telephone Note  I connected with Margaret Preston on 06/14/18 at 11:20 AM EDT by telephone and verified that I am speaking with the correct person using two identifiers.   I discussed the limitations, risks, security and privacy concerns of performing an evaluation and management service by telephone and the availability of in person appointments. I also discussed with the patient that there may be a patient responsible charge related to this service. The patient expressed understanding and agreed to proceed.   History of Present Illness:   The patient is complaining of severe pain in the right clavicle.  It is 5 out of 10 at rest.  She has been taking Tylenol with caution.  She has been working/typing 8 hours a day.  Unable to sleep due to pain.  She is status post lateral right clavicle fracture on 05/22/2018. Follow-up depression, liver cirrhosis Observations/Objective: The patient is in mild distress due to pain.  Right arm is in the sling  Assessment and Plan: To work on Monday if okay See plan Follow Up Instructions:    I discussed the assessment and treatment plan with the patient. The patient was provided an opportunity to ask questions and all were answered. The patient agreed with the plan and demonstrated an understanding of the instructions.   The patient was advised to call back or seek an in-person evaluation if the symptoms worsen or if the condition fails to improve as anticipated.  I provided 20 minutes of non-face-to-face time during this encounter.   Sonda Primes, MD

## 2018-07-07 ENCOUNTER — Encounter: Payer: Self-pay | Admitting: Family Medicine

## 2018-07-07 ENCOUNTER — Other Ambulatory Visit: Payer: Self-pay | Admitting: Internal Medicine

## 2018-07-07 ENCOUNTER — Ambulatory Visit (INDEPENDENT_AMBULATORY_CARE_PROVIDER_SITE_OTHER): Payer: Federal, State, Local not specified - PPO | Admitting: Family Medicine

## 2018-07-07 DIAGNOSIS — M898X1 Other specified disorders of bone, shoulder: Secondary | ICD-10-CM

## 2018-07-07 DIAGNOSIS — S42034D Nondisplaced fracture of lateral end of right clavicle, subsequent encounter for fracture with routine healing: Secondary | ICD-10-CM

## 2018-07-07 NOTE — Progress Notes (Signed)
[facility] [address] [place], [place] [address] Phone: [phone] Subjective:    Virtual Visit via Video Note  I connected with Margaret Preston on 07/07/18 at 11:00 AM EDT by a video enabled telemedicine application and verified that I am speaking with the correct person using two identifiers.     I discussed the limitations of evaluation and management by telemedicine and the availability of in person appointments. The patient expressed understanding and agreed to proceed.  Patient was in her home residence and I was in office setting.  We were the only 2 on the visit.   I discussed the assessment and treatment plan with the patient. The patient was provided an opportunity to ask questions and all were answered. The patient agreed with the plan and demonstrated an understanding of the instructions.   The patient was advised to call back or seek an in-person evaluation if the symptoms worsen or if the condition fails to improve as anticipated.  I provided *26 minutes of non-face-to-face time during this encounter.   Judi Saa, DO    CC: Shoulder pain follow-up  [id number]  Margaret Preston is a 58 y.o. female coming in with complaint of shoulder pain.  Patient was seen by primary care provider.  Patient did have x-rays of the shoulder done and found to have a right sided mid clavicle fracture noted with minimally displaced.  Patient has been doing conservative therapy.  Is out of the sling at this moment.  Has not been doing any specific exercise but has been working on range of motion exercises.  Unable to do physical therapy secondary to coronavirus.  States that it is feeling about 60% better.  Still unable to lift it above her shoulder though.     Past Medical History:  Diagnosis Date  . Depression   . GERD (gastroesophageal reflux disease)   . Hypothyroidism 2009  . Insomnia   . Obesity   . Pancreatitis 2011  . Vitamin  B12 deficiency 2009  . Vitamin D deficiency    Past Surgical History:  Procedure Laterality Date  . APPENDECTOMY    . CESAREAN SECTION    . CHOLECYSTECTOMY    . LAPAROSCOPIC GASTRIC SLEEVE RESECTION  09/05/2014   Social History   Socioeconomic History  . Marital status: Divorced    Spouse name: Not on file  . Number of children: 1  . Years of education: Not on file  . Highest education level: Not on file  Occupational History  . Occupation: Teaching laboratory technician  Social Needs  . Financial resource strain: Not on file  . Food insecurity:    Worry: Not on file    Inability: Not on file  . Transportation needs:    Medical: Not on file    Non-medical: Not on file  Tobacco Use  . Smoking status: Never Smoker  . Smokeless tobacco: Never Used  Substance and Sexual Activity  . Alcohol use: Yes  . Drug use: No  . Sexual activity: Not Currently  Lifestyle  . Physical activity:    Days per week: Not on file    Minutes per session: Not on file  . Stress: Not on file  Relationships  . Social connections:    Talks on phone: Not on file    Gets together: Not on file    Attends religious service: Not on file    Active member of club or organization: Not on file    Attends  meetings of clubs or organizations: Not on file    Relationship status: Not on file  Other Topics Concern  . Not on file  Social History Narrative   Regular exercise-yes   [place]   Allergies  Allergen Reactions  . Trazodone And Nefazodone     Too sedating   Family History  Problem Relation Age of Onset  . Heart disease Mother        pacemaker  . Hypertension Other        Current Outpatient Medications (Analgesics):  .  meloxicam (MOBIC) 7.5 MG tablet, TAKE 1 TABLET BY MOUTH DAILY AS NEEDED FOR PAIN  Current Outpatient Medications (Hematological):  .  cyanocobalamin 1000 MCG tablet, Take 0.5 tablets (500 mcg total) by mouth daily.  Current Outpatient Medications (Other):  .   cholecalciferol (VITAMIN D) 1000 UNITS tablet, Take 1 tablet (1,000 Units total) by mouth daily. Marland Kitchen.  Dexlansoprazole (DEXILANT) 30 MG capsule, Take 1 capsule (30 mg total) by mouth daily for 30 days. .  diazepam (VALIUM) 5 MG tablet, One tablet every 8 hours for muscle spasm .  FLUoxetine (PROZAC) 40 MG capsule, Take 1 capsule (40 mg total) by mouth daily. Marland Kitchen.  LORazepam (ATIVAN) 2 MG tablet, TAKE 1/2 TO 1 TABLET BY MOUTH TWICE A DAY AS NEEDED .  thiamine (VITAMIN B-1) 100 MG tablet, Take 1 tablet (100 mg total) by mouth daily. Marland Kitchen.  tretinoin (RETIN-A) 0.025 % cream, APPLY TOPICALLY AT BEDTIME. ON FACE (Patient taking differently: Apply 1 application topically at bedtime. ) .  valACYclovir (VALTREX) 1000 MG tablet, Take 1 tablet (1,000 mg total) by mouth 3 (three) times daily.    Past medical history, social, surgical and family history all reviewed in electronic medical record.  No pertanent information unless stated regarding to the chief complaint.   Review of Systems:  No headache, visual changes, nausea, vomiting, diarrhea, constipation, dizziness, abdominal pain, skin rash, fevers, chills, night sweats, weight loss, swollen lymph nodes, body aches, joint swelling, chest pain, shortness of breath, mood changes.  Positive muscle aches  Objective    General: No apparent distress alert and oriented x3 mood and affect normal, dressed appropriately.  HEENT: Pupils equal, extraocular movements intact      Impression and Recommendations:      The above documentation has been reviewed and is accurate and complete Judi SaaZachary M Leighanna Kirn, DO       Note: This dictation was prepared with Dragon dictation along with smaller phrase technology. Any transcriptional errors that result from this process are unintentional.

## 2018-07-07 NOTE — Assessment & Plan Note (Signed)
Patient is in clavicle fracture, 1 month out.  We will get repeat x-rays in the near future.  We discussed seeing her know otherwise in 3 weeks.  Went into great detail about range of motion and some mild strengthening exercises.  Continue vitamin D supplementation.  Once again follow-up in 3 weeks

## 2018-07-28 ENCOUNTER — Ambulatory Visit: Payer: Federal, State, Local not specified - PPO | Admitting: Family Medicine

## 2018-08-03 NOTE — Telephone Encounter (Signed)
Pt has decided to wait on appointment but will call if she changes her mind

## 2018-10-04 ENCOUNTER — Other Ambulatory Visit: Payer: Self-pay | Admitting: Internal Medicine

## 2018-10-04 NOTE — Telephone Encounter (Signed)
[place] Controlled Database Checked Last filled: 03/21/18 # 180 LOV w/you: 07/07/18 Next appt w/you: None

## 2018-12-27 ENCOUNTER — Other Ambulatory Visit: Payer: Self-pay | Admitting: Internal Medicine

## 2019-01-23 ENCOUNTER — Other Ambulatory Visit: Payer: Self-pay

## 2019-01-24 MED ORDER — FLUOXETINE HCL 40 MG PO CAPS: 40.0000 mg | ORAL_CAPSULE | Freq: Every day | ORAL | 0 refills | Status: DC

## 2019-05-23 ENCOUNTER — Encounter: Payer: Self-pay | Admitting: Internal Medicine

## 2019-05-23 ENCOUNTER — Other Ambulatory Visit: Payer: Self-pay

## 2019-05-23 ENCOUNTER — Ambulatory Visit (INDEPENDENT_AMBULATORY_CARE_PROVIDER_SITE_OTHER): Payer: Federal, State, Local not specified - PPO | Admitting: Internal Medicine

## 2019-05-23 DIAGNOSIS — J029 Acute pharyngitis, unspecified: Secondary | ICD-10-CM | POA: Insufficient documentation

## 2019-05-23 MED ORDER — AZITHROMYCIN 250 MG PO TABS: ORAL_TABLET | ORAL | 0 refills | Status: DC

## 2019-05-23 NOTE — Assessment & Plan Note (Signed)
?  strep throat

## 2019-05-23 NOTE — Progress Notes (Signed)
Subjective:  Patient ID: Margaret Preston, female    DOB: [birth year]  Age: 59 y.o. MRN: [id number]     Virtual Visit via Video Note  I connected with Margaret Preston on 05/23/19 at  3:40 PM EDT by a video enabled telemedicine application and verified that I am speaking with the correct person using two identifiers.   I discussed the limitations of evaluation and management by telemedicine and the availability of in person appointments. The patient expressed understanding and agreed to proceed.  History of Present Illness: C/o ST  There has been no runny nose, cough, chest pain, shortness of breath, abdominal pain, diarrhea, constipation, arthralgias, skin rashes.   Observations/Objective: The patient appears to be in no acute distress  Assessment and Plan:  See my Assessment and Plan. Follow Up Instructions:    I discussed the assessment and treatment plan with the patient. The patient was provided an opportunity to ask questions and all were answered. The patient agreed with the plan and demonstrated an understanding of the instructions.   The patient was advised to call back or seek an in-person evaluation if the symptoms worsen or if the condition fails to improve as anticipated.  I provided face-to-face time during this encounter. We were at different locations.   Sonda Primes, MD     Outpatient Medications Prior to Visit  Medication Sig Dispense Refill  . cholecalciferol (VITAMIN D) 1000 UNITS tablet Take 1 tablet (1,000 Units total) by mouth daily. 100 tablet 3  . cyanocobalamin 1000 MCG tablet Take 0.5 tablets (500 mcg total) by mouth daily. 100 tablet 3  . Dexlansoprazole (DEXILANT) 30 MG capsule Take 1 capsule (30 mg total) by mouth daily for 30 days. 90 capsule 1  . diazepam (VALIUM) 5 MG tablet One tablet every 8 hours for muscle spasm 12 tablet 0  . FLUoxetine (PROZAC) 40 MG capsule Take 1 capsule (40 mg total) by mouth daily. Annual appt is due must  see provider for future refills 30 capsule 0  . LORazepam (ATIVAN) 2 MG tablet TAKE 1/2 TO 1 TABLET BY MOUTH TWICE A DAY AS NEEDED 180 tablet 1  . meloxicam (MOBIC) 7.5 MG tablet TAKE 1 TABLET BY MOUTH DAILY AS NEEDED FOR PAIN 30 tablet 0  . thiamine (VITAMIN B-1) 100 MG tablet Take 1 tablet (100 mg total) by mouth daily. 100 tablet 1  . tretinoin (RETIN-A) 0.025 % cream APPLY TOPICALLY AT BEDTIME. ON FACE (Patient taking differently: Apply 1 application topically at bedtime. ) 45 g 3   No facility-administered medications prior to visit.    ROS: Review of Systems  Constitutional: Positive for fatigue and fever.  HENT: Positive for sore throat. Negative for rhinorrhea, sinus pressure and voice change.   Respiratory: Negative for cough, shortness of breath and wheezing.     Objective:    Lab Results  Component Value Date   WBC 6.9 02/28/2018   HGB 12.0 02/28/2018   HCT 36.1 02/28/2018   PLT 385 02/28/2018   GLUCOSE 90 02/28/2018   CHOL 138 11/03/2016   TRIG 55.0 11/03/2016   HDL 62.60 11/03/2016   LDLCALC 64 11/03/2016   ALT 83 (H) 02/28/2018   AST 93 (H) 02/28/2018   NA 137 02/28/2018   K 3.8 02/28/2018   CL 105 02/28/2018   CREATININE 0.59 02/28/2018   BUN 11 02/28/2018   CO2 24 02/28/2018   TSH 2.26 04/26/2017   INR 1.0 06/09/2017   HGBA1C 5.9 02/28/2013    DG  Clavicle Right  Result Date: 05/23/2018 CLINICAL DATA:  Fall EXAM: RIGHT CLAVICLE - 2+ VIEWS COMPARISON:  None. FINDINGS: There is a displaced fracture of the mid right clavicle between the middle and peripheral thirds. The [facility] joint is upper normal in with. Humerus is intact. Ribs are grossly intact. IMPRESSION: Acute clavicle fracture. Electronically Signed   By: Marybelle Killings M.D.   On: 05/23/2018 20:17   DG Shoulder Right  Result Date: 05/23/2018 CLINICAL DATA:  Fall yesterday EXAM: RIGHT SHOULDER - 2+ VIEW COMPARISON:  None. FINDINGS: There is a fracture through the mid clavicle. There is marked upper  displacement of the medial fracture fragment. The humerus is intact. The glenoid is intact. Glenohumeral joint is anatomically aligned. IMPRESSION: Mid clavicle fracture. Electronically Signed   By: Marybelle Killings M.D.   On: 05/23/2018 20:14    Assessment & Plan:     Meds ordered this encounter  Medications  . azithromycin (ZITHROMAX Z-PAK) 250 MG tablet    Sig: As directed    Dispense:  6 tablet    Refill:  0     Follow-up: No follow-ups on file.  Walker Kehr, MD

## 2019-08-09 ENCOUNTER — Other Ambulatory Visit: Payer: Self-pay | Admitting: Internal Medicine

## 2019-08-09 NOTE — Telephone Encounter (Signed)
[place] Controlled Database Checked Last filled:  03/21/19  # 180 LOV w/you:  05/23/19 Next appt w/you: None

## 2019-08-11 NOTE — Telephone Encounter (Signed)
    Patient requesting refill for: tretinoin (RETIN-A) 0.025 % cream LORazepam (ATIVAN) 2 MG tablet   Appointment made for 09/21/19

## 2019-09-20 ENCOUNTER — Other Ambulatory Visit: Payer: Self-pay | Admitting: Internal Medicine

## 2019-09-20 DIAGNOSIS — Z1231 Encounter for screening mammogram for malignant neoplasm of breast: Secondary | ICD-10-CM

## 2019-09-21 ENCOUNTER — Ambulatory Visit (INDEPENDENT_AMBULATORY_CARE_PROVIDER_SITE_OTHER): Payer: Federal, State, Local not specified - PPO | Admitting: Internal Medicine

## 2019-09-21 ENCOUNTER — Encounter: Payer: Self-pay | Admitting: Internal Medicine

## 2019-09-21 ENCOUNTER — Ambulatory Visit (INDEPENDENT_AMBULATORY_CARE_PROVIDER_SITE_OTHER): Payer: Federal, State, Local not specified - PPO

## 2019-09-21 ENCOUNTER — Other Ambulatory Visit: Payer: Self-pay

## 2019-09-21 DIAGNOSIS — Z1231 Encounter for screening mammogram for malignant neoplasm of breast: Secondary | ICD-10-CM

## 2019-09-21 DIAGNOSIS — E538 Deficiency of other specified B group vitamins: Secondary | ICD-10-CM | POA: Diagnosis not present

## 2019-09-21 DIAGNOSIS — E559 Vitamin D deficiency, unspecified: Secondary | ICD-10-CM

## 2019-09-21 DIAGNOSIS — R748 Abnormal levels of other serum enzymes: Secondary | ICD-10-CM

## 2019-09-21 DIAGNOSIS — Z0000 Encounter for general adult medical examination without abnormal findings: Secondary | ICD-10-CM | POA: Diagnosis not present

## 2019-09-21 DIAGNOSIS — K7460 Unspecified cirrhosis of liver: Secondary | ICD-10-CM

## 2019-09-21 DIAGNOSIS — F411 Generalized anxiety disorder: Secondary | ICD-10-CM

## 2019-09-21 MED ORDER — TRETINOIN 0.025 % EX CREA: TOPICAL_CREAM | CUTANEOUS | 3 refills | Status: DC

## 2019-09-21 MED ORDER — FLUOXETINE HCL 40 MG PO CAPS: 40.0000 mg | ORAL_CAPSULE | Freq: Every day | ORAL | 3 refills | Status: DC

## 2019-09-21 MED ORDER — LORAZEPAM 2 MG PO TABS: ORAL_TABLET | ORAL | 1 refills | Status: DC

## 2019-09-21 NOTE — Assessment & Plan Note (Signed)
Lorazepam prn  Potential benefits of a long term benzodiazepines  use as well as potential risks  and complications were explained to the patient and were aknowledged. Fluoxetine, Trazodone

## 2019-09-21 NOTE — Assessment & Plan Note (Addendum)
Do not drink 

## 2019-09-21 NOTE — Progress Notes (Signed)
Subjective:  Patient ID: Margaret Preston, female    DOB: Dec 20, 1960  Age: 59 y.o. MRN: [id number]  CC: No chief complaint on file.   HPI Margaret Preston presents for a well exam Not taking B12  Outpatient Medications Prior to Visit  Medication Sig Dispense Refill  . cholecalciferol (VITAMIN D) 1000 UNITS tablet Take 1 tablet (1,000 Units total) by mouth daily. 100 tablet 3  . cyanocobalamin 1000 MCG tablet Take 0.5 tablets (500 mcg total) by mouth daily. 100 tablet 3  . FLUoxetine (PROZAC) 40 MG capsule Take 1 capsule (40 mg total) by mouth daily. Annual appt is due must see provider for future refills 30 capsule 0  . LORazepam (ATIVAN) 2 MG tablet TAKE 1/2 TO 1 TABLET BY MOUTH TWICE A DAY AS NEEDED 60 tablet 0  . meloxicam (MOBIC) 7.5 MG tablet TAKE 1 TABLET BY MOUTH DAILY AS NEEDED FOR PAIN 30 tablet 0  . tretinoin (RETIN-A) 0.025 % cream APPLY TOPICALLY AT BEDTIME. ON FACE (Patient taking differently: Apply 1 application topically at bedtime. ) 45 g 3  . azithromycin (ZITHROMAX Z-PAK) 250 MG tablet As directed (Patient not taking: Reported on 09/21/2019) 6 tablet 0  . Dexlansoprazole (DEXILANT) 30 MG capsule Take 1 capsule (30 mg total) by mouth daily for 30 days. 90 capsule 1  . diazepam (VALIUM) 5 MG tablet One tablet every 8 hours for muscle spasm (Patient not taking: Reported on 09/21/2019) 12 tablet 0  . thiamine (VITAMIN B-1) 100 MG tablet Take 1 tablet (100 mg total) by mouth daily. (Patient not taking: Reported on 09/21/2019) 100 tablet 1   No facility-administered medications prior to visit.    ROS: Review of Systems  Constitutional: Negative for activity change, appetite change, chills, fatigue and unexpected weight change.  HENT: Negative for congestion, mouth sores and sinus pressure.   Eyes: Negative for visual disturbance.  Respiratory: Negative for cough and chest tightness.   Gastrointestinal: Negative for abdominal pain and nausea.  Genitourinary: Negative for  difficulty urinating, frequency and vaginal pain.  Musculoskeletal: Negative for back pain and gait problem.  Skin: Negative for pallor and rash.  Neurological: Negative for dizziness, tremors, weakness, numbness and headaches.  Psychiatric/Behavioral: Negative for confusion and sleep disturbance.    Objective:  BP 102/70 (BP Location: Right Arm, Patient Position: Sitting, Cuff Size: Large)   Pulse 73   Temp 98.6 F (37 C) (Oral)   Ht 5\' 6"  (1.676 m)   Wt 211 lb (95.7 kg)   LMP 05/08/2014   SpO2 98%   BMI 34.06 kg/m   BP Readings from Last 3 Encounters:  09/21/19 102/70  05/23/18 110/80  02/28/18 120/82    Wt Readings from Last 3 Encounters:  09/21/19 211 lb (95.7 kg)  05/23/18 202 lb (91.6 kg)  02/28/18 202 lb 12.8 oz (92 kg)    Physical Exam Constitutional:      General: She is not in acute distress.    Appearance: She is well-developed. She is obese.  HENT:     Head: Normocephalic.     Right Ear: External ear normal.     Left Ear: External ear normal.     Nose: Nose normal.  Eyes:     General:        Right eye: No discharge.        Left eye: No discharge.     Conjunctiva/sclera: Conjunctivae normal.     Pupils: Pupils are equal, round, and reactive to light.  Neck:  Thyroid: No thyromegaly.     Vascular: No JVD.     Trachea: No tracheal deviation.  Cardiovascular:     Rate and Rhythm: Normal rate and regular rhythm.     Heart sounds: Normal heart sounds.  Pulmonary:     Effort: No respiratory distress.     Breath sounds: No stridor. No wheezing.  Abdominal:     General: Bowel sounds are normal. There is no distension.     Palpations: Abdomen is soft. There is no mass.     Tenderness: There is no abdominal tenderness. There is no guarding or rebound.  Musculoskeletal:        General: No tenderness.     Cervical back: Normal range of motion and neck supple.  Lymphadenopathy:     Cervical: No cervical adenopathy.  Skin:    Findings: No erythema or  rash.  Neurological:     Mental Status: She is oriented to person, place, and time.     Cranial Nerves: No cranial nerve deficit.     Motor: No abnormal muscle tone.     Coordination: Coordination normal.     Deep Tendon Reflexes: Reflexes normal.  Psychiatric:        Behavior: Behavior normal.        Thought Content: Thought content normal.        Judgment: Judgment normal.     Lab Results  Component Value Date   WBC 6.9 02/28/2018   HGB 12.0 02/28/2018   HCT 36.1 02/28/2018   PLT 385 02/28/2018   GLUCOSE 90 02/28/2018   CHOL 138 11/03/2016   TRIG 55.0 11/03/2016   HDL 62.60 11/03/2016   LDLCALC 64 11/03/2016   ALT 83 (H) 02/28/2018   AST 93 (H) 02/28/2018   NA 137 02/28/2018   K 3.8 02/28/2018   CL 105 02/28/2018   CREATININE 0.59 02/28/2018   BUN 11 02/28/2018   CO2 24 02/28/2018   TSH 2.26 04/26/2017   INR 1.0 06/09/2017   HGBA1C 5.9 02/28/2013    DG Clavicle Right  Result Date: 05/23/2018 CLINICAL DATA:  Fall EXAM: RIGHT CLAVICLE - 2+ VIEWS COMPARISON:  None. FINDINGS: There is a displaced fracture of the mid right clavicle between the middle and peripheral thirds. The [facility] joint is upper normal in with. Humerus is intact. Ribs are grossly intact. IMPRESSION: Acute clavicle fracture. Electronically Signed   By: Jolaine Click M.D.   On: 05/23/2018 20:17   DG Shoulder Right  Result Date: 05/23/2018 CLINICAL DATA:  Fall yesterday EXAM: RIGHT SHOULDER - 2+ VIEW COMPARISON:  None. FINDINGS: There is a fracture through the mid clavicle. There is marked upper displacement of the medial fracture fragment. The humerus is intact. The glenoid is intact. Glenohumeral joint is anatomically aligned. IMPRESSION: Mid clavicle fracture. Electronically Signed   By: Jolaine Click M.D.   On: 05/23/2018 20:14    Assessment & Plan:   There are no diagnoses linked to this encounter.   No orders of the defined types were placed in this encounter.    Follow-up: No follow-ups on  file.  Sonda Primes, MD

## 2019-09-21 NOTE — Assessment & Plan Note (Signed)
Vit d 

## 2019-09-21 NOTE — Assessment & Plan Note (Signed)
Do not drink

## 2019-09-21 NOTE — Assessment & Plan Note (Signed)
Risks associated with treatment noncompliance were discussed. Compliance was encouraged. 

## 2019-09-26 ENCOUNTER — Telehealth: Payer: Self-pay

## 2019-09-26 NOTE — Telephone Encounter (Signed)
The authorization for Tretinoin cream 0.025% is valid from 08/27/2019 through 09/25/2020

## 2019-09-26 NOTE — Telephone Encounter (Signed)
PA Started for Tretinoin cream 0.025% Key: [id number] - PA Case ID: [id number] - Rx #: [id number]

## 2019-11-06 ENCOUNTER — Telehealth: Payer: Self-pay | Admitting: Internal Medicine

## 2019-11-10 NOTE — Telephone Encounter (Signed)
error 

## 2019-11-21 NOTE — Telephone Encounter (Signed)
Patient is calling to follow up on the forms.  I do not have these forms. Do you have them back there?  She is requesting a call back about the status.

## 2019-11-23 ENCOUNTER — Other Ambulatory Visit: Payer: Self-pay | Admitting: Internal Medicine

## 2019-11-23 DIAGNOSIS — F3341 Major depressive disorder, recurrent, in partial remission: Secondary | ICD-10-CM

## 2019-11-27 ENCOUNTER — Ambulatory Visit: Payer: Federal, State, Local not specified - PPO | Admitting: Internal Medicine

## 2019-11-27 ENCOUNTER — Encounter: Payer: Self-pay | Admitting: Internal Medicine

## 2019-11-27 ENCOUNTER — Other Ambulatory Visit: Payer: Self-pay

## 2019-11-27 DIAGNOSIS — K7460 Unspecified cirrhosis of liver: Secondary | ICD-10-CM | POA: Diagnosis not present

## 2019-11-27 DIAGNOSIS — F3341 Major depressive disorder, recurrent, in partial remission: Secondary | ICD-10-CM | POA: Diagnosis not present

## 2019-11-27 DIAGNOSIS — E038 Other specified hypothyroidism: Secondary | ICD-10-CM

## 2019-11-27 MED ORDER — VORTIOXETINE HBR 10 MG PO TABS
10.0000 mg | ORAL_TABLET | Freq: Every day | ORAL | 5 refills | Status: DC
Start: 2019-11-27 — End: 2019-11-27

## 2019-11-27 MED ORDER — VORTIOXETINE HBR 10 MG PO TABS: 10.0000 mg | ORAL_TABLET | Freq: Every day | ORAL | 5 refills | Status: DC

## 2019-11-27 NOTE — Progress Notes (Signed)
Subjective:  Patient ID: Margaret Preston, female    DOB: Mar 14, 1960  Age: 58 y.o. MRN: [id number]  CC: Follow-up (FMLA paperwork and referral for counseling/psyhcharist)   HPI Margaret Preston presents for severe depression, "crashed" emotionally, tearful.  She was worked in to be seen today.  Outpatient Medications Prior to Visit  Medication Sig Dispense Refill  . cholecalciferol (VITAMIN D) 1000 UNITS tablet Take 1 tablet (1,000 Units total) by mouth daily. 100 tablet 3  . cyanocobalamin 1000 MCG tablet Take 0.5 tablets (500 mcg total) by mouth daily. 100 tablet 3  . FLUoxetine (PROZAC) 40 MG capsule Take 1 capsule (40 mg total) by mouth daily. Annual appt is due must see provider for future refills 90 capsule 3  . LORazepam (ATIVAN) 2 MG tablet TAKE 1/2 TO 1 TABLET BY MOUTH TWICE A DAY AS NEEDED 180 tablet 1  . tretinoin (RETIN-A) 0.025 % cream APPLY TOPICALLY AT BEDTIME. ON FACE 45 g 3  . Dexlansoprazole (DEXILANT) 30 MG capsule Take 1 capsule (30 mg total) by mouth daily for 30 days. 90 capsule 1  . ibuprofen (ADVIL) 400 MG tablet Take 400 mg by mouth every 6 (six) hours. (Patient not taking: Reported on 11/27/2019)     No facility-administered medications prior to visit.    ROS: Review of Systems  Constitutional: Positive for fatigue. Negative for activity change, appetite change, chills and unexpected weight change.  HENT: Negative for congestion, mouth sores and sinus pressure.   Eyes: Negative for visual disturbance.  Respiratory: Negative for cough and chest tightness.   Gastrointestinal: Negative for abdominal pain and nausea.  Genitourinary: Negative for difficulty urinating, frequency and vaginal pain.  Musculoskeletal: Negative for back pain and gait problem.  Skin: Negative for pallor and rash.  Neurological: Negative for dizziness, tremors, weakness, numbness and headaches.  Psychiatric/Behavioral: Positive for decreased concentration and dysphoric mood. Negative  for behavioral problems, confusion, sleep disturbance and suicidal ideas. The patient is nervous/anxious.     Objective:  BP 126/78 (BP Location: Right Arm, Patient Position: Sitting, Cuff Size: Normal)   Pulse 62   Temp 97.9 F (36.6 C) (Oral)   Ht 5\' 6"  (1.676 m)   Wt 217 lb (98.4 kg)   LMP 05/08/2014   SpO2 96%   BMI 35.02 kg/m   BP Readings from Last 3 Encounters:  11/27/19 126/78  09/21/19 102/70  05/23/18 110/80    Wt Readings from Last 3 Encounters:  11/27/19 217 lb (98.4 kg)  09/21/19 211 lb (95.7 kg)  05/23/18 202 lb (91.6 kg)    Physical Exam Constitutional:      General: She is not in acute distress.    Appearance: She is well-developed.  HENT:     Head: Normocephalic.     Right Ear: External ear normal.     Left Ear: External ear normal.     Nose: Nose normal.  Eyes:     General:        Right eye: No discharge.        Left eye: No discharge.     Conjunctiva/sclera: Conjunctivae normal.     Pupils: Pupils are equal, round, and reactive to light.  Neck:     Thyroid: No thyromegaly.     Vascular: No JVD.     Trachea: No tracheal deviation.  Cardiovascular:     Rate and Rhythm: Normal rate and regular rhythm.     Heart sounds: Normal heart sounds.  Pulmonary:     Effort: No respiratory  distress.     Breath sounds: No stridor. No wheezing.  Abdominal:     General: Bowel sounds are normal. There is no distension.     Palpations: Abdomen is soft. There is no mass.     Tenderness: There is no abdominal tenderness. There is no guarding or rebound.  Musculoskeletal:        General: No tenderness.     Cervical back: Normal range of motion and neck supple.  Lymphadenopathy:     Cervical: No cervical adenopathy.  Skin:    Findings: No erythema or rash.  Neurological:     Mental Status: She is oriented to person, place, and time.     Cranial Nerves: No cranial nerve deficit.     Motor: No abnormal muscle tone.     Coordination: Coordination normal.      Deep Tendon Reflexes: Reflexes normal.  Psychiatric:        Behavior: Behavior normal.        Thought Content: Thought content normal.        Judgment: Judgment normal.    [place] FMLA form filled out Lab Results  Component Value Date   WBC 6.9 02/28/2018   HGB 12.0 02/28/2018   HCT 36.1 02/28/2018   PLT 385 02/28/2018   GLUCOSE 90 02/28/2018   CHOL 138 11/03/2016   TRIG 55.0 11/03/2016   HDL 62.60 11/03/2016   LDLCALC 64 11/03/2016   ALT 83 (H) 02/28/2018   AST 93 (H) 02/28/2018   NA 137 02/28/2018   K 3.8 02/28/2018   CL 105 02/28/2018   CREATININE 0.59 02/28/2018   BUN 11 02/28/2018   CO2 24 02/28/2018   TSH 2.26 04/26/2017   INR 1.0 06/09/2017   HGBA1C 5.9 02/28/2013    DG Clavicle Right  Result Date: 05/23/2018 CLINICAL DATA:  Fall EXAM: RIGHT CLAVICLE - 2+ VIEWS COMPARISON:  None. FINDINGS: There is a displaced fracture of the mid right clavicle between the middle and peripheral thirds. The [facility] joint is upper normal in with. Humerus is intact. Ribs are grossly intact. IMPRESSION: Acute clavicle fracture. Electronically Signed   By: Jolaine Click M.D.   On: 05/23/2018 20:17   DG Shoulder Right  Result Date: 05/23/2018 CLINICAL DATA:  Fall yesterday EXAM: RIGHT SHOULDER - 2+ VIEW COMPARISON:  None. FINDINGS: There is a fracture through the mid clavicle. There is marked upper displacement of the medial fracture fragment. The humerus is intact. The glenoid is intact. Glenohumeral joint is anatomically aligned. IMPRESSION: Mid clavicle fracture. Electronically Signed   By: Jolaine Click M.D.   On: 05/23/2018 20:14    Assessment & Plan:     Follow-up: No follow-ups on file.  Sonda Primes, MD

## 2019-11-27 NOTE — Assessment & Plan Note (Signed)
Labs

## 2019-11-27 NOTE — Patient Instructions (Signed)
Becoming Supernatural How Common People are Doing the Uncommon By Yetta Glassman  2017

## 2019-11-27 NOTE — Assessment & Plan Note (Signed)
Worse Psychology ref

## 2019-11-27 NOTE — Assessment & Plan Note (Signed)
No ETOH Labs

## 2019-12-04 NOTE — Telephone Encounter (Signed)
I received FMLA forms.  Forms have been placed in provider box to review and sign.

## 2019-12-05 DIAGNOSIS — Z0279 Encounter for issue of other medical certificate: Secondary | ICD-10-CM

## 2019-12-05 NOTE — Telephone Encounter (Signed)
Forms in [place] possession

## 2019-12-05 NOTE — Telephone Encounter (Signed)
Forms have been signed, No fax number for employer, Copy sent to scan &Charged for.   Called x2, No VM - My chart message sent to inform patient original is ready to be picked up.

## 2020-01-23 ENCOUNTER — Telehealth: Payer: Self-pay | Admitting: Internal Medicine

## 2020-01-23 MED ORDER — VENLAFAXINE HCL ER 75 MG PO CP24
75.0000 mg | ORAL_CAPSULE | Freq: Every day | ORAL | 5 refills | Status: DC
Start: 2020-01-23 — End: 2020-01-25

## 2020-01-23 NOTE — Telephone Encounter (Signed)
Ok Effexor instead Thx

## 2020-01-23 NOTE — Telephone Encounter (Signed)
Requesting a alternative,./lmb

## 2020-01-23 NOTE — Telephone Encounter (Signed)
Tried calling pt there was no answer, and vm is not set-up not able to leave msg. Will call back.Marland KitchenRaechel Chute

## 2020-01-23 NOTE — Telephone Encounter (Signed)
    Pt c/o medication issue:  1. Name of Medication: vortioxetine HBr (TRINTELLIX) 10 MG TABS tablet  2. How are you currently taking this medication (dosage and times per day)? As written  3. Are you having a reaction (difficulty breathing--STAT)? no  4. What is your medication issue? Patient calling to report medication too costly ($300)

## 2020-01-24 NOTE — Telephone Encounter (Signed)
Please advise 

## 2020-01-24 NOTE — Telephone Encounter (Signed)
Patient called back. She can be reached at [phone]

## 2020-01-24 NOTE — Telephone Encounter (Signed)
   Patient called back, states she is NOT taking Effexor. She said she likes Trintellix, is there any way to get a discount. Patient states she is not trying any other medications

## 2020-01-25 MED ORDER — VORTIOXETINE HBR 10 MG PO TABS: 10.0000 mg | ORAL_TABLET | Freq: Every day | ORAL | 5 refills | Status: DC

## 2020-03-28 ENCOUNTER — Ambulatory Visit: Payer: Federal, State, Local not specified - PPO | Admitting: Internal Medicine

## 2020-06-08 ENCOUNTER — Other Ambulatory Visit: Payer: Self-pay | Admitting: Internal Medicine

## 2020-10-29 ENCOUNTER — Telehealth: Payer: Self-pay | Admitting: Internal Medicine

## 2020-10-29 NOTE — Telephone Encounter (Signed)
1.Medication Requested: LORazepam (ATIVAN) 2 MG tablet 2. Pharmacy (Name, Street, [place]): [phone] - , [place] - [address] 3. On Med List: Y  4. Last Visit with PCP: 11/27/19  5. Next visit date with PCP: 11/04/2020   Agent: Please be advised that RX refills may take up to 3 business days. We ask that you follow-up with your pharmacy.   Patient also requested her labs to be ordered for her upcoming physical 11/04/2020.   Please advise.

## 2020-10-30 MED ORDER — LORAZEPAM 2 MG PO TABS: ORAL_TABLET | ORAL | 2 refills | Status: DC

## 2020-10-30 NOTE — Telephone Encounter (Signed)
Okay.  Thanks.

## 2020-11-04 ENCOUNTER — Encounter: Payer: Self-pay | Admitting: Internal Medicine

## 2020-11-04 ENCOUNTER — Other Ambulatory Visit: Payer: Self-pay

## 2020-11-04 ENCOUNTER — Ambulatory Visit (INDEPENDENT_AMBULATORY_CARE_PROVIDER_SITE_OTHER): Payer: Federal, State, Local not specified - PPO | Admitting: Internal Medicine

## 2020-11-04 VITALS — BP 118/78 | HR 88 | Temp 98.3°F | Ht 66.0 in | Wt 227.8 lb

## 2020-11-04 DIAGNOSIS — K7460 Unspecified cirrhosis of liver: Secondary | ICD-10-CM | POA: Diagnosis not present

## 2020-11-04 DIAGNOSIS — E538 Deficiency of other specified B group vitamins: Secondary | ICD-10-CM

## 2020-11-04 DIAGNOSIS — Z23 Encounter for immunization: Secondary | ICD-10-CM | POA: Diagnosis not present

## 2020-11-04 DIAGNOSIS — F411 Generalized anxiety disorder: Secondary | ICD-10-CM

## 2020-11-04 DIAGNOSIS — E559 Vitamin D deficiency, unspecified: Secondary | ICD-10-CM

## 2020-11-04 DIAGNOSIS — F3341 Major depressive disorder, recurrent, in partial remission: Secondary | ICD-10-CM

## 2020-11-04 DIAGNOSIS — Z0000 Encounter for general adult medical examination without abnormal findings: Secondary | ICD-10-CM | POA: Diagnosis not present

## 2020-11-04 LAB — CBC WITH DIFFERENTIAL/PLATELET
Basophils Absolute: 0 10*3/uL (ref 0.0–0.1)
Basophils Relative: 0.5 % (ref 0.0–3.0)
Eosinophils Absolute: 0.2 10*3/uL (ref 0.0–0.7)
Eosinophils Relative: 2.3 % (ref 0.0–5.0)
HCT: 36.1 % (ref 36.0–46.0)
Hemoglobin: 11.8 g/dL — ABNORMAL LOW (ref 12.0–15.0)
Lymphocytes Relative: 51.7 % — ABNORMAL HIGH (ref 12.0–46.0)
Lymphs Abs: 3.8 10*3/uL (ref 0.7–4.0)
MCHC: 32.8 g/dL (ref 30.0–36.0)
MCV: 89.8 fl (ref 78.0–100.0)
Monocytes Absolute: 0.7 10*3/uL (ref 0.1–1.0)
Monocytes Relative: 9.1 % (ref 3.0–12.0)
Neutro Abs: 2.7 10*3/uL (ref 1.4–7.7)
Neutrophils Relative %: 36.4 % — ABNORMAL LOW (ref 43.0–77.0)
Platelets: 416 10*3/uL — ABNORMAL HIGH (ref 150.0–400.0)
RBC: 4.02 Mil/uL (ref 3.87–5.11)
RDW: 14.3 % (ref 11.5–15.5)
WBC: 7.4 10*3/uL (ref 4.0–10.5)

## 2020-11-04 LAB — URINALYSIS
Hgb urine dipstick: NEGATIVE
Ketones, ur: NEGATIVE
Leukocytes,Ua: NEGATIVE
Nitrite: NEGATIVE
Specific Gravity, Urine: 1.025 (ref 1.000–1.030)
Total Protein, Urine: NEGATIVE
Urine Glucose: NEGATIVE
Urobilinogen, UA: 1 (ref 0.0–1.0)
pH: 6 (ref 5.0–8.0)

## 2020-11-04 LAB — LIPID PANEL
Cholesterol: 130 mg/dL (ref 0–200)
HDL: 58.2 mg/dL (ref >39.00)
LDL Cholesterol: 60 mg/dL (ref 0–99)
NonHDL: 72.19
Total CHOL/HDL Ratio: 2
Triglycerides: 62 mg/dL (ref 0.0–149.0)
VLDL: 12.4 mg/dL (ref 0.0–40.0)

## 2020-11-04 LAB — COMPREHENSIVE METABOLIC PANEL
ALT: 35 U/L (ref 0–35)
AST: 39 U/L — ABNORMAL HIGH (ref 0–37)
Albumin: 3.4 g/dL — ABNORMAL LOW (ref 3.5–5.2)
Alkaline Phosphatase: 219 U/L — ABNORMAL HIGH (ref 39–117)
BUN: 12 mg/dL (ref 6–23)
CO2: 26 mEq/L (ref 19–32)
Calcium: 9.2 mg/dL (ref 8.4–10.5)
Chloride: 103 mEq/L (ref 96–112)
Creatinine, Ser: 0.87 mg/dL (ref 0.40–1.20)
GFR: 71.92 mL/min (ref >60.00)
Glucose, Bld: 82 mg/dL (ref 70–99)
Potassium: 3.7 mEq/L (ref 3.5–5.1)
Sodium: 136 mEq/L (ref 135–145)
Total Bilirubin: 0.4 mg/dL (ref 0.2–1.2)
Total Protein: 7.7 g/dL (ref 6.0–8.3)

## 2020-11-04 LAB — TSH: TSH: 3.02 u[IU]/mL (ref 0.35–5.50)

## 2020-11-04 LAB — PROTIME-INR
INR: 1.1 ratio — ABNORMAL HIGH (ref 0.8–1.0)
Prothrombin Time: 11.6 s (ref 9.6–13.1)

## 2020-11-04 MED ORDER — FLUOXETINE HCL 40 MG PO CAPS: 40.0000 mg | ORAL_CAPSULE | Freq: Every day | ORAL | 3 refills | Status: DC

## 2020-11-04 MED ORDER — ERGOCALCIFEROL 1.25 MG (50000 UT) PO CAPS: 50000.0000 [IU] | ORAL_CAPSULE | ORAL | 3 refills | Status: AC

## 2020-11-04 NOTE — Assessment & Plan Note (Signed)
Lorazepam prn  Potential benefits of a long term benzodiazepines  use as well as potential risks  and complications were explained to the patient and were aknowledged.  

## 2020-11-04 NOTE — Addendum Note (Signed)
Addended by: Deatra James on: 11/04/2020 03:51 PM   Modules accepted: Orders

## 2020-11-04 NOTE — Assessment & Plan Note (Addendum)
We discussed age appropriate health related issues, including available/recomended screening tests and vaccinations. Labs were ordered to be later reviewed . All questions were answered. We discussed one or more of the following - seat belt use, use of sunscreen/sun exposure exercise, safe sex, fall risk reduction, second hand smoke exposure, firearm use and storage, seat belt use, a need for adhering to healthy diet and exercise. Labs were ordered. All questions were answered. Mammogram Prevnar 20

## 2020-11-04 NOTE — Progress Notes (Signed)
Subjective:  Patient ID: Margaret Preston, female    DOB: [birth year]  Age: 60 y.o. MRN: [id number]  CC: Annual Exam   HPI Margaret Preston presents for a well exam  Outpatient Medications Prior to Visit  Medication Sig Dispense Refill   cholecalciferol (VITAMIN D) 1000 UNITS tablet Take 1 tablet (1,000 Units total) by mouth daily. 100 tablet 3   cyanocobalamin 1000 MCG tablet Take 0.5 tablets (500 mcg total) by mouth daily. 100 tablet 3   FLUoxetine (PROZAC) 40 MG capsule Take 1 capsule (40 mg total) by mouth daily. Annual appt is due must see provider for future refills 90 capsule 3   LORazepam (ATIVAN) 2 MG tablet TAKE 1/2-1 TABLET BY MOUTH TWICE A DAY AS NEEDED 60 tablet 2   Dexlansoprazole (DEXILANT) 30 MG capsule Take 1 capsule (30 mg total) by mouth daily for 30 days. 90 capsule 1   ibuprofen (ADVIL) 400 MG tablet Take 400 mg by mouth every 6 (six) hours. (Patient not taking: No sig reported)     tretinoin (RETIN-A) 0.025 % cream APPLY TOPICALLY AT BEDTIME. ON FACE (Patient not taking: Reported on 11/04/2020) 45 g 3   vortioxetine HBr (TRINTELLIX) 10 MG TABS tablet Take 1 tablet (10 mg total) by mouth daily. (Patient not taking: Reported on 11/04/2020) 30 tablet 5   No facility-administered medications prior to visit.    ROS: Review of Systems  Constitutional:  Negative for activity change, appetite change, chills, fatigue and unexpected weight change.  HENT:  Negative for congestion, mouth sores and sinus pressure.   Eyes:  Negative for visual disturbance.  Respiratory:  Negative for cough and chest tightness.   Gastrointestinal:  Negative for abdominal pain and nausea.  Genitourinary:  Negative for difficulty urinating, frequency and vaginal pain.  Musculoskeletal:  Negative for back pain and gait problem.  Skin:  Negative for pallor and rash.  Neurological:  Negative for dizziness, tremors, weakness, numbness and headaches.  Psychiatric/Behavioral:  Negative for  confusion, sleep disturbance and suicidal ideas. The patient is nervous/anxious.    Objective:  BP 118/78 (BP Location: Left Arm)   Pulse 88   Temp 98.3 F (36.8 C) (Oral)   Ht 5\' 6"  (1.676 m)   Wt 227 lb 12.8 oz (103.3 kg)   LMP 05/08/2014   SpO2 97%   BMI 36.77 kg/m   BP Readings from Last 3 Encounters:  11/04/20 118/78  11/27/19 126/78  09/21/19 102/70    Wt Readings from Last 3 Encounters:  11/04/20 227 lb 12.8 oz (103.3 kg)  11/27/19 217 lb (98.4 kg)  09/21/19 211 lb (95.7 kg)    Physical Exam Constitutional:      General: She is not in acute distress.    Appearance: She is well-developed. She is obese.  HENT:     Head: Normocephalic.     Right Ear: External ear normal.     Left Ear: External ear normal.     Nose: Nose normal.  Eyes:     General:        Right eye: No discharge.        Left eye: No discharge.     Conjunctiva/sclera: Conjunctivae normal.     Pupils: Pupils are equal, round, and reactive to light.  Neck:     Thyroid: No thyromegaly.     Vascular: No JVD.     Trachea: No tracheal deviation.  Cardiovascular:     Rate and Rhythm: Normal rate and regular rhythm.  Heart sounds: Normal heart sounds.  Pulmonary:     Effort: No respiratory distress.     Breath sounds: No stridor. No wheezing.  Abdominal:     General: Bowel sounds are normal. There is no distension.     Palpations: Abdomen is soft. There is no mass.     Tenderness: There is no abdominal tenderness. There is no guarding or rebound.  Musculoskeletal:        General: No tenderness.     Cervical back: Normal range of motion and neck supple. No rigidity.  Lymphadenopathy:     Cervical: No cervical adenopathy.  Skin:    Findings: No erythema or rash.  Neurological:     Cranial Nerves: No cranial nerve deficit.     Motor: No abnormal muscle tone.     Coordination: Coordination normal.     Deep Tendon Reflexes: Reflexes normal.  Psychiatric:        Behavior: Behavior normal.         Thought Content: Thought content normal.        Judgment: Judgment normal.    Lab Results  Component Value Date   WBC 6.9 02/28/2018   HGB 12.0 02/28/2018   HCT 36.1 02/28/2018   PLT 385 02/28/2018   GLUCOSE 90 02/28/2018   CHOL 138 11/03/2016   TRIG 55.0 11/03/2016   HDL 62.60 11/03/2016   LDLCALC 64 11/03/2016   ALT 83 (H) 02/28/2018   AST 93 (H) 02/28/2018   NA 137 02/28/2018   K 3.8 02/28/2018   CL 105 02/28/2018   CREATININE 0.59 02/28/2018   BUN 11 02/28/2018   CO2 24 02/28/2018   TSH 2.26 04/26/2017   INR 1.0 06/09/2017   HGBA1C 5.9 02/28/2013    DG Clavicle Right  Result Date: 05/23/2018 CLINICAL DATA:  Fall EXAM: RIGHT CLAVICLE - 2+ VIEWS COMPARISON:  None. FINDINGS: There is a displaced fracture of the mid right clavicle between the middle and peripheral thirds. The [facility] joint is upper normal in with. Humerus is intact. Ribs are grossly intact. IMPRESSION: Acute clavicle fracture. Electronically Signed   By: Jolaine Click M.D.   On: 05/23/2018 20:17   DG Shoulder Right  Result Date: 05/23/2018 CLINICAL DATA:  Fall yesterday EXAM: RIGHT SHOULDER - 2+ VIEW COMPARISON:  None. FINDINGS: There is a fracture through the mid clavicle. There is marked upper displacement of the medial fracture fragment. The humerus is intact. The glenoid is intact. Glenohumeral joint is anatomically aligned. IMPRESSION: Mid clavicle fracture. Electronically Signed   By: Jolaine Click M.D.   On: 05/23/2018 20:14    Assessment & Plan:     Follow-up: No follow-ups on file.  Sonda Primes, MD

## 2020-11-04 NOTE — Addendum Note (Signed)
Addended by: Waldemar Dickens B on: 11/04/2020 02:42 PM   Modules accepted: Orders

## 2020-11-04 NOTE — Assessment & Plan Note (Signed)
Wt management center ref

## 2020-11-04 NOTE — Assessment & Plan Note (Signed)
Check AFP, INR

## 2020-11-04 NOTE — Assessment & Plan Note (Signed)
Cont on Fluoxetine 

## 2020-11-04 NOTE — Assessment & Plan Note (Signed)
On Vit B12 

## 2020-11-04 NOTE — Assessment & Plan Note (Signed)
Cont on Vit D - start monthly Rx

## 2020-11-05 LAB — AFP TUMOR MARKER: AFP-Tumor Marker: 3.2 ng/mL

## 2020-11-07 ENCOUNTER — Other Ambulatory Visit: Payer: Self-pay | Admitting: Internal Medicine

## 2020-11-07 ENCOUNTER — Telehealth: Payer: Self-pay | Admitting: Internal Medicine

## 2020-11-07 DIAGNOSIS — Z1231 Encounter for screening mammogram for malignant neoplasm of breast: Secondary | ICD-10-CM

## 2020-11-07 NOTE — Telephone Encounter (Signed)
    Please call patient to discuss lab results 

## 2020-11-07 NOTE — Telephone Encounter (Signed)
Called pt back.. she states she actually saw results on her mychart, but it looks like a word was left out. Looked back on labs.. inform pt that MD meant to say "ok", but her liver test still remain elevated. Went over her AST & ALT results with her. Pt states she understands now and appreciate the call.Marland KitchenRaechel Chute

## 2020-11-08 ENCOUNTER — Other Ambulatory Visit: Payer: Self-pay

## 2020-11-08 ENCOUNTER — Ambulatory Visit
Admission: RE | Admit: 2020-11-08 | Discharge: 2020-11-08 | Disposition: A | Discharge status: Home / Self Care | Payer: Federal, State, Local not specified - PPO | Source: Ambulatory Visit | Attending: Internal Medicine | Admitting: Internal Medicine

## 2020-11-08 DIAGNOSIS — K7460 Unspecified cirrhosis of liver: Secondary | ICD-10-CM | POA: Diagnosis not present

## 2020-11-13 NOTE — Telephone Encounter (Signed)
Pt requesting u/s results.Marland KitchenRaechel Preston

## 2020-12-12 ENCOUNTER — Ambulatory Visit: Payer: Federal, State, Local not specified - PPO

## 2021-02-28 ENCOUNTER — Other Ambulatory Visit: Payer: Self-pay | Admitting: Internal Medicine

## 2021-03-04 NOTE — Telephone Encounter (Signed)
Patient is requesting a refill of the following medications: Requested Prescriptions   Pending Prescriptions Disp Refills   FLUoxetine (PROZAC) 40 MG capsule [Pharmacy Med Name: FLUOXETINE HCL 40 MG CAPSULE] 90 capsule 3    Sig: TAKE 1 CAPSULE BY MOUTH EVERY DAY    Date of patient request: 02/28/21 Last office visit: 11/04/20  Date of last refill: 11/04/20 Last refill amount: 90, 3 Follow up time period per chart: n/a

## 2021-03-18 ENCOUNTER — Encounter: Payer: Self-pay | Admitting: Internal Medicine

## 2021-03-18 ENCOUNTER — Other Ambulatory Visit: Payer: Self-pay

## 2021-03-18 ENCOUNTER — Ambulatory Visit: Payer: Federal, State, Local not specified - PPO | Admitting: Internal Medicine

## 2021-03-18 DIAGNOSIS — R21 Rash and other nonspecific skin eruption: Secondary | ICD-10-CM | POA: Insufficient documentation

## 2021-03-18 MED ORDER — CLOTRIMAZOLE-BETAMETHASONE 1-0.05 % EX CREA: 1.0000 "application " | TOPICAL_CREAM | Freq: Two times a day (BID) | CUTANEOUS | 1 refills | Status: DC

## 2021-03-18 NOTE — Progress Notes (Signed)
Subjective:  Patient ID: Wende Crease, female    DOB: 03/07/1961  Age: 61 y.o. MRN: [id number]  CC: Referral (Facial discoloration patches/)   HPI Reanne Amble presents for lighter skin w/o pigment raised itchy patches on the face x 6 weeks  Outpatient Medications Prior to Visit  Medication Sig Dispense Refill   cyanocobalamin 1000 MCG tablet Take 0.5 tablets (500 mcg total) by mouth daily. 100 tablet 3   ergocalciferol (VITAMIN D2) 1.25 MG (50000 UT) capsule Take 1 capsule (50,000 Units total) by mouth once a week. 3 capsule 3   FLUoxetine (PROZAC) 40 MG capsule TAKE 1 CAPSULE BY MOUTH EVERY DAY 90 capsule 3   LORazepam (ATIVAN) 2 MG tablet TAKE 1/2-1 TABLET BY MOUTH TWICE A DAY AS NEEDED 60 tablet 2   No facility-administered medications prior to visit.    ROS: Review of Systems  Constitutional:  Negative for activity change, appetite change, chills, fatigue and unexpected weight change.  HENT:  Negative for congestion, mouth sores and sinus pressure.   Eyes:  Negative for visual disturbance.  Respiratory:  Negative for cough and chest tightness.   Gastrointestinal:  Negative for abdominal pain and nausea.  Genitourinary:  Negative for difficulty urinating, frequency and vaginal pain.  Musculoskeletal:  Negative for back pain and gait problem.  Skin:  Positive for color change and rash. Negative for pallor.  Neurological:  Negative for dizziness, tremors, weakness, numbness and headaches.  Psychiatric/Behavioral:  Negative for confusion and sleep disturbance.    Objective:  BP 120/76 (BP Location: Left Arm, Patient Position: Sitting, Cuff Size: Normal)    Pulse 69    Temp 98.9 F (37.2 C) (Oral)    Ht 5\' 6"  (1.676 m)    Wt 228 lb 9.6 oz (103.7 kg)    LMP 05/08/2014    SpO2 98%    BMI 36.90 kg/m   BP Readings from Last 3 Encounters:  03/18/21 120/76  11/04/20 118/78  11/27/19 126/78    Wt Readings from Last 3 Encounters:  03/18/21 228 lb 9.6 oz (103.7 kg)   11/04/20 227 lb 12.8 oz (103.3 kg)  11/27/19 217 lb (98.4 kg)    Physical Exam Constitutional:      Appearance: She is well-developed.  HENT:     Head: Normocephalic.     Right Ear: External ear normal.     Left Ear: External ear normal.     Nose: Nose normal.  Eyes:     General:        Right eye: No discharge.        Left eye: No discharge.     Conjunctiva/sclera: Conjunctivae normal.     Pupils: Pupils are equal, round, and reactive to light.  Neck:     Thyroid: No thyromegaly.     Vascular: No JVD.     Trachea: No tracheal deviation.  Cardiovascular:     Rate and Rhythm: Normal rate and regular rhythm.     Heart sounds: Normal heart sounds.  Abdominal:     General: Bowel sounds are normal. There is no distension.     Palpations: Abdomen is soft. There is no mass.     Tenderness: There is no abdominal tenderness. There is no guarding or rebound.  Musculoskeletal:        General: No tenderness.     Cervical back: Normal range of motion and neck supple.  Skin:    Findings: Rash present. No erythema.  Neurological:     Cranial Nerves:  No cranial nerve deficit.     Motor: No abnormal muscle tone.  Psychiatric:        Behavior: Behavior normal.        Thought Content: Thought content normal.        Judgment: Judgment normal.   Rash on face - new lighter skin w/o pigment raised scaly itchy patches on the face, naso-labial folds area x 6 weeks   Lab Results  Component Value Date   WBC 7.4 11/04/2020   HGB 11.8 (L) 11/04/2020   HCT 36.1 11/04/2020   PLT 416.0 (H) 11/04/2020   GLUCOSE 82 11/04/2020   CHOL 130 11/04/2020   TRIG 62.0 11/04/2020   HDL 58.20 11/04/2020   LDLCALC 60 11/04/2020   ALT 35 11/04/2020   AST 39 (H) 11/04/2020   NA 136 11/04/2020   K 3.7 11/04/2020   CL 103 11/04/2020   CREATININE 0.87 11/04/2020   BUN 12 11/04/2020   CO2 26 11/04/2020   TSH 3.02 11/04/2020   INR 1.1 (H) 11/04/2020   HGBA1C 5.9 02/28/2013    US Abdomen Limited RUQ  (LIVER/GB)  Result Date: 11/10/2020 CLINICAL DATA:  Cirrhosis follow-up EXAM: ULTRASOUND ABDOMEN LIMITED RIGHT UPPER QUADRANT COMPARISON:  CT abdomen pelvis 06/23/2017 FINDINGS: Gallbladder: Surgically absent. Common bile duct: Diameter: 0.6 cm, within normal limits Liver: No focal lesion identified. Normal parenchymal echogenicity. Coarsened and heterogeneous echotexture. Contour nodularity. Portal vein is patent on color Doppler imaging with normal direction of blood flow towards the liver. Other: None. IMPRESSION: Appearance of the liver in keeping with history of cirrhosis. No new definite focal lesion though study is somewhat limited by shadowing bowel gas. Electronically Signed   By: Audie Pinto M.D.   On: 11/10/2020 13:34    Assessment & Plan:   Problem List Items Addressed This Visit     Rash    Rash on face - new lighter skin w/o pigment raised itchy patches on the face x 6 weeks - naso-labial folds area SD vs eczema vs other Lotrisone cream Derm ref      Relevant Orders   Ambulatory referral to Dermatology      Meds ordered this encounter  Medications   clotrimazole-betamethasone (LOTRISONE) cream    Sig: Apply 1 application topically 2 (two) times daily. Use on rash    Dispense:  30 g    Refill:  1      Follow-up: No follow-ups on file.  Walker Kehr, MD

## 2021-03-18 NOTE — Assessment & Plan Note (Addendum)
Rash on face - new lighter skin w/o pigment raised itchy patches on the face x 6 weeks - naso-labial folds area SD vs eczema vs other Lotrisone cream Derm ref

## 2021-04-29 DIAGNOSIS — L218 Other seborrheic dermatitis: Secondary | ICD-10-CM | POA: Diagnosis not present

## 2021-05-10 ENCOUNTER — Other Ambulatory Visit: Payer: Self-pay | Admitting: Internal Medicine

## 2021-06-23 ENCOUNTER — Encounter: Payer: Self-pay | Admitting: Internal Medicine

## 2021-06-27 ENCOUNTER — Encounter (HOSPITAL_BASED_OUTPATIENT_CLINIC_OR_DEPARTMENT_OTHER): Payer: Self-pay | Admitting: *Deleted

## 2021-06-27 ENCOUNTER — Emergency Department (HOSPITAL_BASED_OUTPATIENT_CLINIC_OR_DEPARTMENT_OTHER): Payer: Federal, State, Local not specified - PPO

## 2021-06-27 ENCOUNTER — Other Ambulatory Visit: Payer: Self-pay

## 2021-06-27 ENCOUNTER — Emergency Department (HOSPITAL_BASED_OUTPATIENT_CLINIC_OR_DEPARTMENT_OTHER)
Admission: EM | Admit: 2021-06-27 | Discharge: 2021-06-27 | Disposition: A | Discharge status: Home / Self Care | Payer: Federal, State, Local not specified - PPO | Attending: Emergency Medicine | Admitting: Emergency Medicine

## 2021-06-27 DIAGNOSIS — J40 Bronchitis, not specified as acute or chronic: Secondary | ICD-10-CM | POA: Diagnosis not present

## 2021-06-27 DIAGNOSIS — Z20822 Contact with and (suspected) exposure to covid-19: Secondary | ICD-10-CM | POA: Diagnosis not present

## 2021-06-27 DIAGNOSIS — R059 Cough, unspecified: Secondary | ICD-10-CM | POA: Diagnosis not present

## 2021-06-27 LAB — RESP PANEL BY RT-PCR (FLU A&B, COVID) ARPGX2
Influenza A by PCR: NEGATIVE
Influenza B by PCR: NEGATIVE
SARS Coronavirus 2 by RT PCR: NEGATIVE

## 2021-06-27 MED ORDER — PREDNISONE 10 MG PO TABS
60.0000 mg | ORAL_TABLET | Freq: Once | ORAL | Status: AC
  Administered 2021-06-27: 60 mg via ORAL
  Filled 2021-06-27: qty 1

## 2021-06-27 MED ORDER — DOXYCYCLINE HYCLATE 100 MG PO CAPS: 100.0000 mg | ORAL_CAPSULE | Freq: Two times a day (BID) | ORAL | 0 refills | Status: DC

## 2021-06-27 MED ORDER — PREDNISONE 10 MG PO TABS: 20.0000 mg | ORAL_TABLET | Freq: Every day | ORAL | 0 refills | Status: DC

## 2021-06-27 MED ORDER — IPRATROPIUM-ALBUTEROL 0.5-2.5 (3) MG/3ML IN SOLN
3.0000 mL | Freq: Once | RESPIRATORY_TRACT | Status: AC
  Administered 2021-06-27: 3 mL via RESPIRATORY_TRACT
  Filled 2021-06-27: qty 3

## 2021-06-27 MED ORDER — ALBUTEROL SULFATE HFA 108 (90 BASE) MCG/ACT IN AERS: 1.0000 | INHALATION_SPRAY | Freq: Four times a day (QID) | RESPIRATORY_TRACT | 0 refills | Status: DC | PRN

## 2021-06-27 NOTE — ED Triage Notes (Signed)
Pt reports cough x 3 weeks, "wheezing". States "this is the sickest I've ever been". Neg home covid test today. Endorses dizziness ?

## 2021-06-27 NOTE — Discharge Instructions (Signed)
Use the albuterol inhaler every 6 hours as needed for coughing and wheezing. ?Take the doxycycline twice daily for the next 10 days. ?Take 40 mg of prednisone for the next 5 days. ?Follow-up with your primary in 1 week for reevaluation to make sure your lungs are improving. ?Return if things change or worsen. ?

## 2021-06-27 NOTE — ED Provider Notes (Signed)
?[facility] EMERGENCY DEPARTMENT ?Provider Note ? ? ?CSN: [id number] ?Arrival date & time: 06/27/21  1648 ? ?  ? ?History ? ?Chief Complaint  ?Patient presents with  ? Cough  ? ? ?Margaret Preston is a 61 y.o. female. ? ? ?Cough ? ?  ?Patient with no pertinent medical history presents today due to cough and wheezing x3 weeks.  She had started as a viral process, was having fevers and chills as well as a productive cough.  Started feeling more short of breath over the last 2 weeks at rest and with exertion.  No associated chest pain.  Denies any smoking history, no history of asthma or COPD. ? ?Home Medications ?Prior to Admission medications   ?Medication Sig Start Date End Date Taking? Authorizing Provider  ?albuterol (VENTOLIN HFA) 108 (90 Base) MCG/ACT inhaler Inhale 1-2 puffs into the lungs every 6 (six) hours as needed for wheezing or shortness of breath. 06/27/21  Yes Theron Arista, PA-C  ?doxycycline (VIBRAMYCIN) 100 MG capsule Take 1 capsule (100 mg total) by mouth 2 (two) times daily. 06/27/21  Yes Theron Arista, PA-C  ?predniSONE (DELTASONE) 10 MG tablet Take 2 tablets (20 mg total) by mouth daily. 06/27/21  Yes Theron Arista, PA-C  ?clotrimazole-betamethasone (LOTRISONE) cream Apply 1 application topically 2 (two) times daily. Use on rash 03/18/21   Plotnikov, Georgina Quint, MD  ?cyanocobalamin 1000 MCG tablet Take 0.5 tablets (500 mcg total) by mouth daily. 06/21/13   Plotnikov, Georgina Quint, MD  ?ergocalciferol (VITAMIN D2) 1.25 MG (50000 UT) capsule Take 1 capsule (50,000 Units total) by mouth once a week. 11/04/20 11/04/21  Plotnikov, Georgina Quint, MD  ?FLUoxetine (PROZAC) 40 MG capsule TAKE 1 CAPSULE BY MOUTH EVERY DAY 03/05/21   Plotnikov, Georgina Quint, MD  ?LORazepam (ATIVAN) 2 MG tablet TAKE 1/2 TO 1 TABLET BY MOUTH TWICE A DAY AS NEEDED 05/12/21   Plotnikov, Georgina Quint, MD  ?   ? ?Allergies    ?Trazodone and nefazodone   ? ?Review of Systems   ?Review of Systems  ?Respiratory:  Positive for cough.   ? ?Physical  Exam ?Updated Vital Signs ?BP (!) 123/94 (BP Location: Left Arm)   Pulse 64   Temp 98.3 ?F (36.8 ?C) (Oral)   Resp 18   Ht 5\' 6"  (1.676 m)   Wt 108.9 kg   LMP 05/08/2014   SpO2 98%   BMI 38.74 kg/m?  ?Physical Exam ?Vitals and nursing note reviewed.  ?Constitutional:   ?   General: She is not in acute distress. ?   Appearance: She is well-developed.  ?HENT:  ?   Head: Normocephalic and atraumatic.  ?Eyes:  ?   Conjunctiva/sclera: Conjunctivae normal.  ?Cardiovascular:  ?   Rate and Rhythm: Normal rate and regular rhythm.  ?   Heart sounds: No murmur heard. ?Pulmonary:  ?   Effort: Pulmonary effort is normal. No respiratory distress.  ?   Breath sounds: Wheezing present.  ?   Comments: Speaking in complete sentences.  Expiratory wheezing noted, worse upper lobes ?Abdominal:  ?   Palpations: Abdomen is soft.  ?   Tenderness: There is no abdominal tenderness.  ?Musculoskeletal:     ?   General: No swelling.  ?   Cervical back: Neck supple.  ?Skin: ?   General: Skin is warm and dry.  ?   Capillary Refill: Capillary refill takes less than 2 seconds.  ?Neurological:  ?   Mental Status: She is alert.  ?Psychiatric:     ?  Mood and Affect: Mood normal.  ? ? ?ED Results / Procedures / Treatments   ?Labs ?(all labs ordered are listed, but only abnormal results are displayed) ?Labs Reviewed  ?RESP PANEL BY RT-PCR (FLU A&B, COVID) [id number]  ? ? ?EKG ?None ? ?Radiology ?DG Chest 2 View ? ?Result Date: 06/27/2021 ?CLINICAL DATA:  Cough. EXAM: CHEST - 2 VIEW COMPARISON:  Chest x-ray 11/24/2010. FINDINGS: No consolidation. No visible pleural effusions or pneumothorax. Cardiopericardial silhouette is within normal limits. No displaced fracture. Degenerative changes of the spine. Right upper quadrant clips. IMPRESSION: No evidence of acute cardiopulmonary disease. Electronically Signed   By: Feliberto Harts M.D.   On: 06/27/2021 17:22   ? ?Procedures ?Procedures  ? ? ?Medications Ordered in ED ?Medications   ?ipratropium-albuterol (DUONEB) 0.5-2.5 (3) MG/3ML nebulizer solution 3 mL (3 mLs Nebulization Given 06/27/21 1731)  ?predniSONE (DELTASONE) tablet 60 mg (60 mg Oral Given 06/27/21 1740)  ? ? ?ED Course/ Medical Decision Making/ A&P ?  ?                        ?Medical Decision Making ?Amount and/or Complexity of Data Reviewed ?Radiology: ordered. ? ?Risk ?Prescription drug management. ? ? ?Patient presents with cough and wheezing x3 weeks.   ? ?On exam she does have expiratory wheezing, no hypoxia or tachycardia. ? ?I ordered, viewed and interpreted chest x-ray which does not show any acute process such as a pneumonia. ? ?COVID and flu test are also negative. ? ?Suspect patient has a bronchitis, will discharge with albuterol, doxycycline and steroid Dosepak.  Encourage PCP follow-up outpatient, discharged in stable condition. ? ? ? ? ? ? ? ?Final Clinical Impression(s) / ED Diagnoses ?Final diagnoses:  ?Bronchitis  ? ? ?Rx / DC Orders ?ED Discharge Orders   ? ?      Ordered  ?  doxycycline (VIBRAMYCIN) 100 MG capsule  2 times daily       ? 06/27/21 1813  ?  predniSONE (DELTASONE) 10 MG tablet  Daily       ? 06/27/21 1813  ?  albuterol (VENTOLIN HFA) 108 (90 Base) MCG/ACT inhaler  Every 6 hours PRN       ? 06/27/21 1813  ? ?  ?  ? ?  ? ? ?  ?Theron Arista, PA-C ?06/27/21 1824 ? ?  ?Rozelle Logan, DO ?06/27/21 1827 ? ?

## 2021-09-16 IMAGING — MG DIGITAL SCREENING BILAT W/ TOMO W/ CAD
8 of 14 series · 8 of 40 positions shown · non-contrast
Comparison: None.

CLINICAL DATA: Screening.

EXAM:
DIGITAL SCREENING BILATERAL MAMMOGRAM WITH TOMO AND CAD

[L MLO synth-2D (1 of 2)]
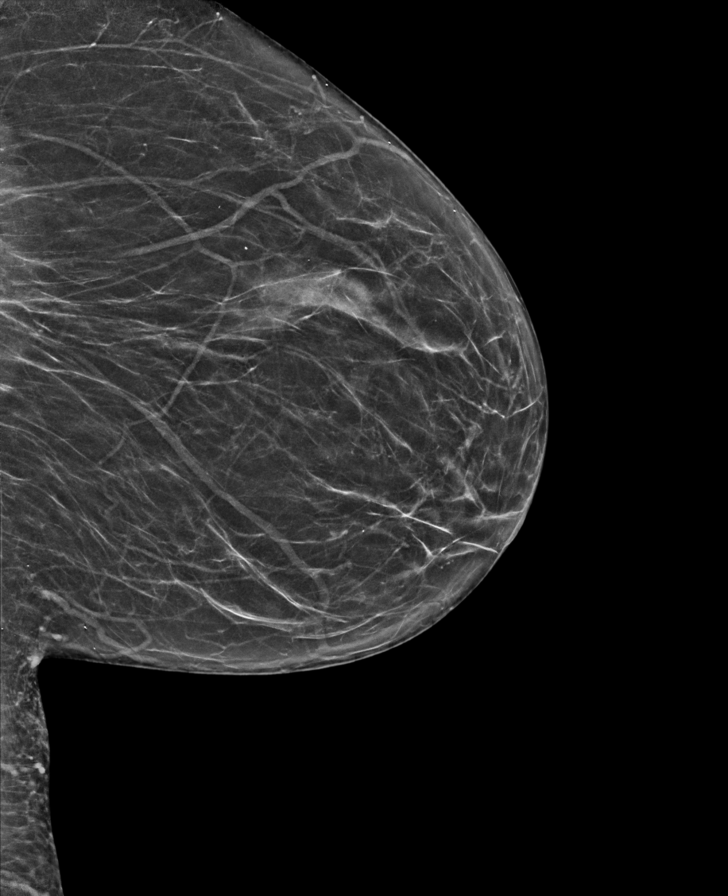

[L CC synth-2D (1 of 2)]
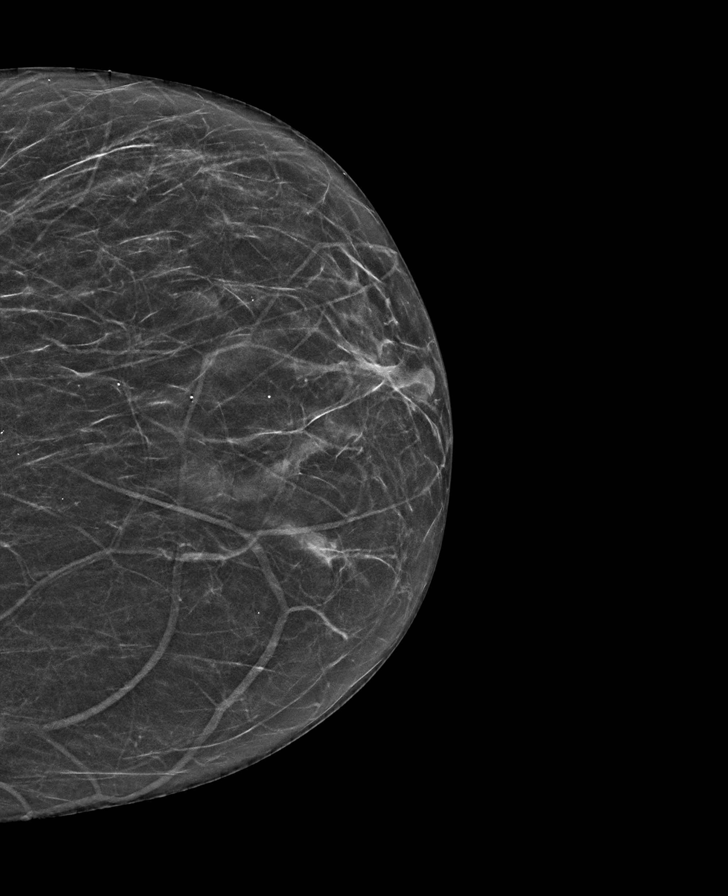

[R CC synth-2D (1 of 2)]
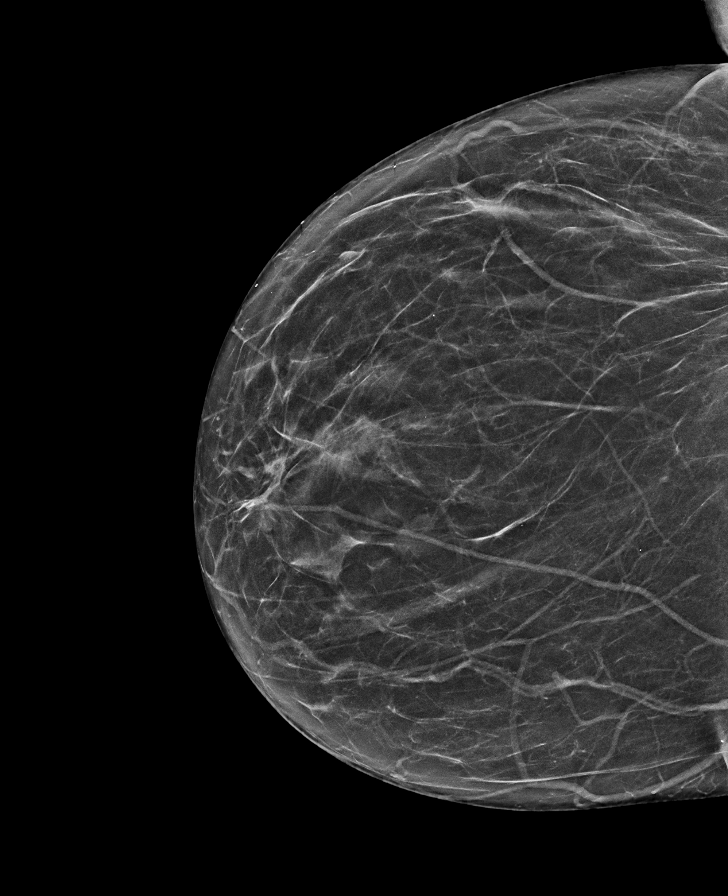

[R CC synth-2D (2 of 2)]
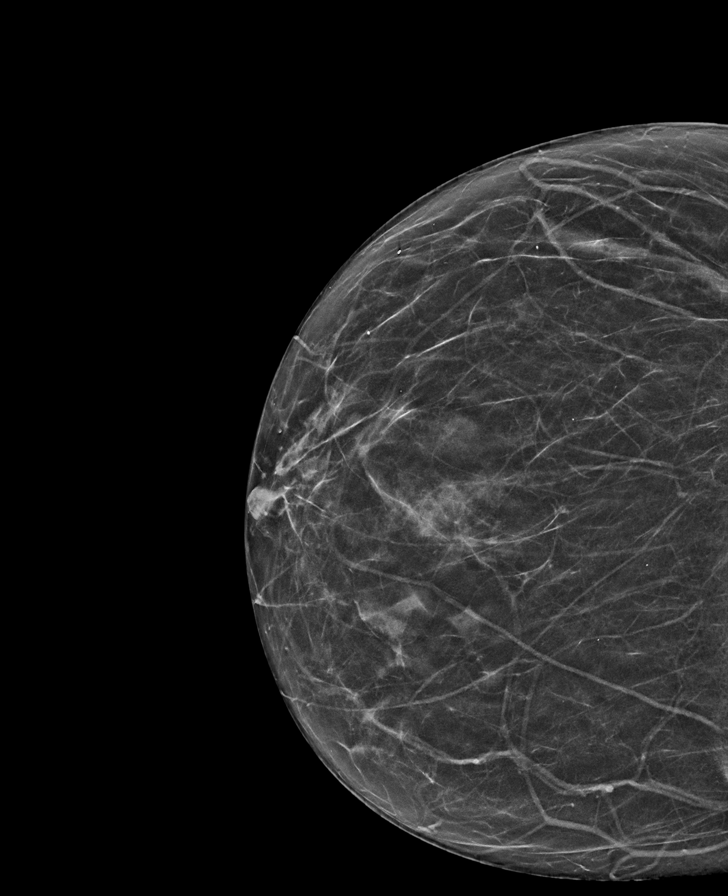

[L MLO synth-2D (2 of 2)]
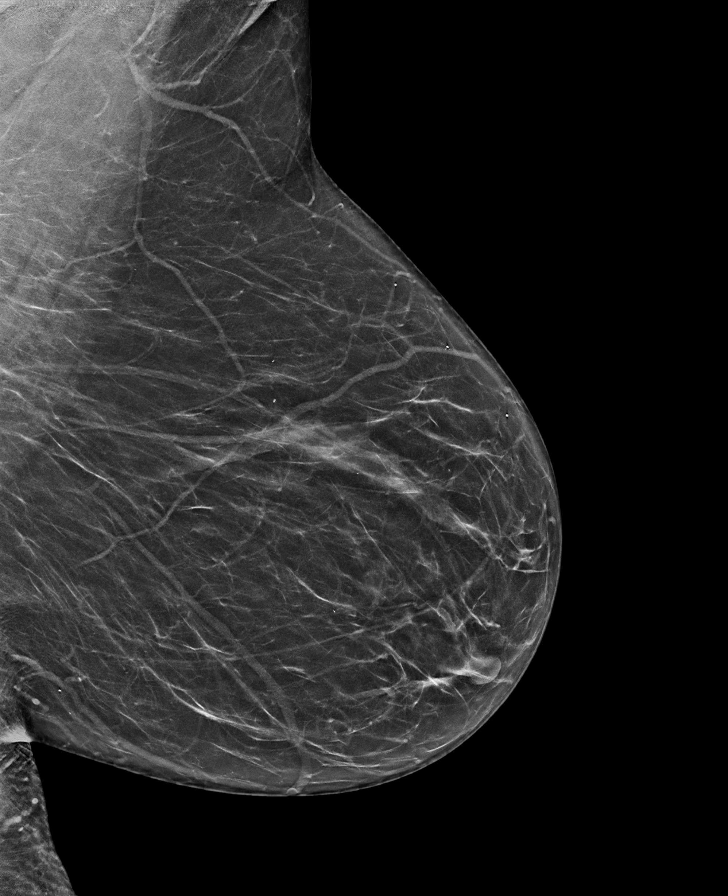

[R MLO synth-2D]
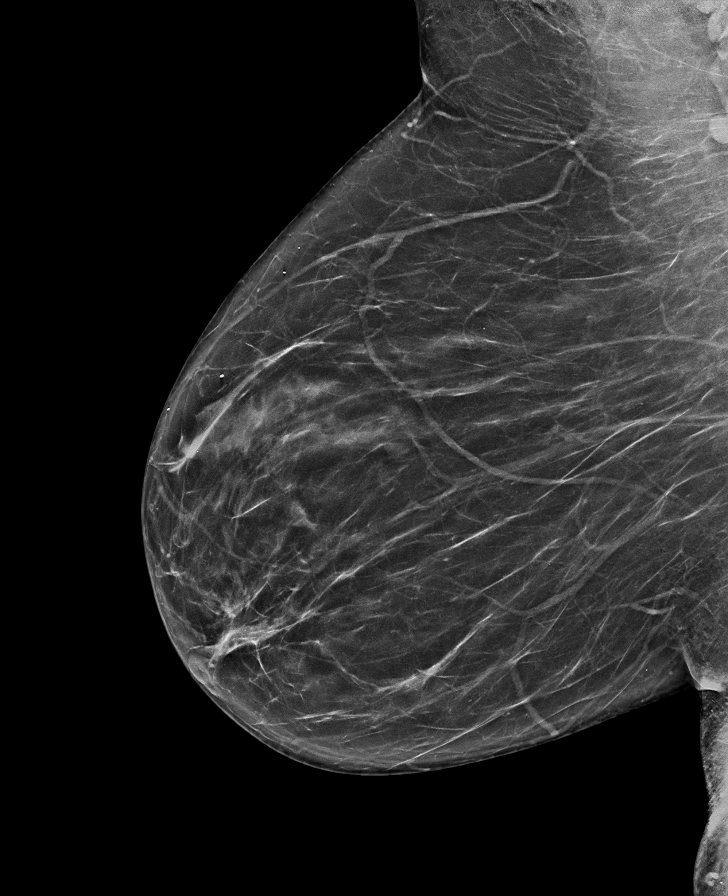

[L CC synth-2D (2 of 2)]
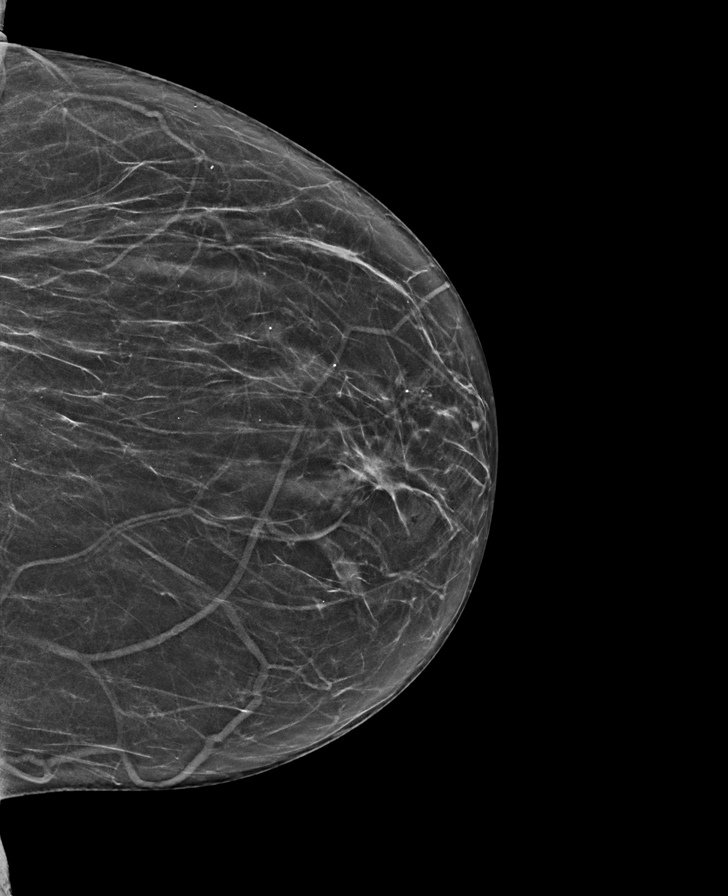

[L MLO tomo · tomo slice 31/60.0]
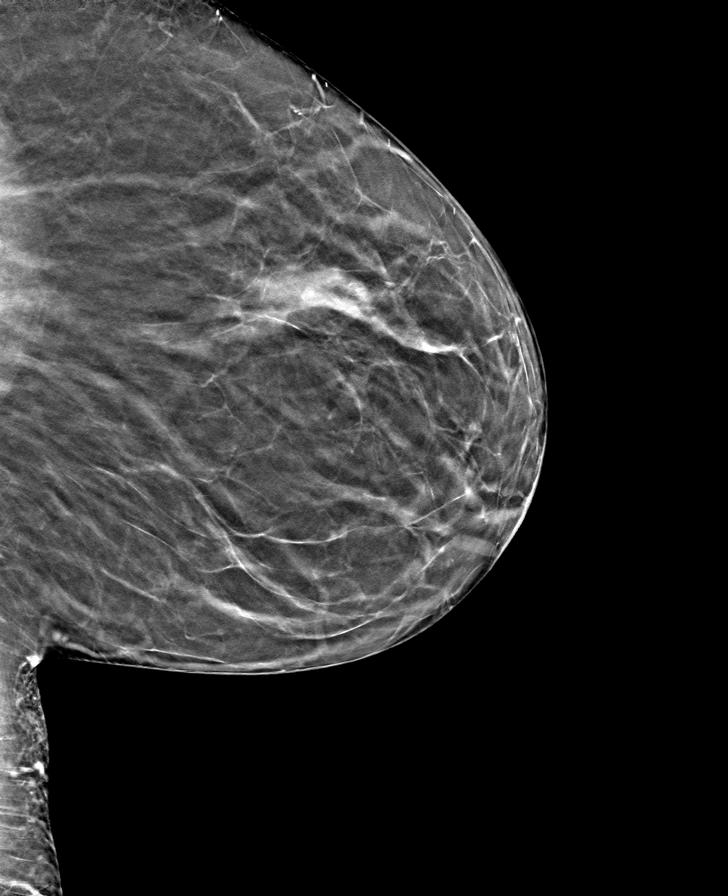

[8 of 40 positions shown; findings below may reference images not displayed]

ACR Breast Density Category b: There are scattered areas of
fibroglandular density.
FINDINGS: There are no findings suspicious for malignancy. Images were
processed with CAD.
IMPRESSION: No mammographic evidence of malignancy. A result letter of this
screening mammogram will be mailed directly to the patient.

RECOMMENDATION:
Screening mammogram in one year. (Code:Y5-G-EJ6)

BI-RADS CATEGORY  1: Negative.

## 2022-01-07 ENCOUNTER — Telehealth: Payer: Self-pay | Admitting: *Deleted

## 2022-01-07 ENCOUNTER — Encounter (HOSPITAL_BASED_OUTPATIENT_CLINIC_OR_DEPARTMENT_OTHER): Payer: Self-pay | Admitting: *Deleted

## 2022-01-07 ENCOUNTER — Emergency Department (HOSPITAL_BASED_OUTPATIENT_CLINIC_OR_DEPARTMENT_OTHER)
Admission: EM | Admit: 2022-01-07 | Discharge: 2022-01-07 | Disposition: A | Discharge status: Home / Self Care | Payer: Federal, State, Local not specified - PPO | Attending: Emergency Medicine | Admitting: Emergency Medicine

## 2022-01-07 ENCOUNTER — Encounter: Payer: Self-pay | Admitting: *Deleted

## 2022-01-07 ENCOUNTER — Other Ambulatory Visit: Payer: Self-pay

## 2022-01-07 DIAGNOSIS — Y93G1 Activity, food preparation and clean up: Secondary | ICD-10-CM | POA: Insufficient documentation

## 2022-01-07 DIAGNOSIS — Z23 Encounter for immunization: Secondary | ICD-10-CM | POA: Diagnosis not present

## 2022-01-07 DIAGNOSIS — W268XXA Contact with other sharp object(s), not elsewhere classified, initial encounter: Secondary | ICD-10-CM | POA: Diagnosis not present

## 2022-01-07 DIAGNOSIS — S61211A Laceration without foreign body of left index finger without damage to nail, initial encounter: Secondary | ICD-10-CM | POA: Insufficient documentation

## 2022-01-07 DIAGNOSIS — S6992XA Unspecified injury of left wrist, hand and finger(s), initial encounter: Secondary | ICD-10-CM | POA: Diagnosis not present

## 2022-01-07 MED ORDER — TETANUS-DIPHTHERIA TOXOIDS TD 5-2 LFU IM INJ
0.5000 mL | INJECTION | Freq: Once | INTRAMUSCULAR | Status: DC
  Filled 2022-01-07: qty 0.5

## 2022-01-07 MED ORDER — TETANUS-DIPHTH-ACELL PERTUSSIS 5-2.5-18.5 LF-MCG/0.5 IM SUSY
0.5000 mL | PREFILLED_SYRINGE | Freq: Once | INTRAMUSCULAR | Status: AC
  Administered 2022-01-07: 0.5 mL via INTRAMUSCULAR

## 2022-01-07 NOTE — Discharge Instructions (Signed)
1.  Leave the dressing over your finger for the next 2 days.  May remove it if needed for bathing.  Keep the finger covered and protected with splint is much as you can for the next week.  Not apply any lotions or ointments to the glue.  It will make it come off too early.

## 2022-01-07 NOTE — ED Provider Notes (Signed)
[facility] EMERGENCY DEPARTMENT Provider Note   CSN: [id number] Arrival date & time: 01/07/22  1737     History  Chief Complaint  Patient presents with   Finger Injury    Margaret Preston is a 61 y.o. female.  HPI Patient reached into her dishwasher and cut her left index finger on a sharp metal edge.  She reports it kept bleeding and bleeding and she cleaned it and put alcohol on it.  She put pressure on antibiotic ointment.  It finally stopped bleeding after she got to the emergency department for evaluation.    Home Medications Prior to Admission medications   Medication Sig Start Date End Date Taking? Authorizing Provider  albuterol (VENTOLIN HFA) 108 (90 Base) MCG/ACT inhaler Inhale 1-2 puffs into the lungs every 6 (six) hours as needed for wheezing or shortness of breath. 06/27/21   Sherrill Raring, PA-C  clotrimazole-betamethasone (LOTRISONE) cream Apply 1 application topically 2 (two) times daily. Use on rash 03/18/21   Plotnikov, Evie Lacks, MD  cyanocobalamin 1000 MCG tablet Take 0.5 tablets (500 mcg total) by mouth daily. 06/21/13   Plotnikov, Evie Lacks, MD  doxycycline (VIBRAMYCIN) 100 MG capsule Take 1 capsule (100 mg total) by mouth 2 (two) times daily. 06/27/21   Sherrill Raring, PA-C  FLUoxetine (PROZAC) 40 MG capsule TAKE 1 CAPSULE BY MOUTH EVERY DAY 03/05/21   Plotnikov, Evie Lacks, MD  LORazepam (ATIVAN) 2 MG tablet TAKE 1/2 TO 1 TABLET BY MOUTH TWICE A DAY AS NEEDED 05/12/21   Plotnikov, Evie Lacks, MD  predniSONE (DELTASONE) 10 MG tablet Take 2 tablets (20 mg total) by mouth daily. 06/27/21   Sherrill Raring, PA-C      Allergies    Trazodone and nefazodone    Review of Systems   Review of Systems  Physical Exam Updated Vital Signs BP 127/65 (BP Location: Right Arm)   Pulse (!) 51   Temp 97.8 F (36.6 C) (Oral)   Resp 18   LMP 05/08/2014   SpO2 100%  Physical Exam Constitutional:      Comments: Alert and well in appearance.  Eyes:     Extraocular  Movements: Extraocular movements intact.  Pulmonary:     Effort: Pulmonary effort is normal.  Musculoskeletal:     Comments: Pad of left index finger has a approximately 1 cm shallow arc laceration.  No active bleeding.  Wound is clean and dry.  Skin:    General: Skin is warm and dry.     ED Results / Procedures / Treatments   Labs (all labs ordered are listed, but only abnormal results are displayed) Labs Reviewed - No data to display  EKG None  Radiology No results found.  Procedures Procedures   Dermabond applied to clean, dry fingertip laceration Medications Ordered in ED Medications  Tdap (BOOSTRIX) injection 0.5 mL (has no administration in time range)    ED Course/ Medical Decision Making/ A&P                           Medical Decision Making  Patient has a minor laceration to index finger pad.  She has extensively cleaned this at home.  Is no longer bleeding.  Wound is well approximated.  Dermabond applied.  Bulky dressing and finger splint applied.  Patient counseled on home management.  Patient reports her tetanus is greater than 10 years.  Tdap updated in the emergency department.        Final  Clinical Impression(s) / ED Diagnoses Final diagnoses:  Laceration of left index finger without foreign body without damage to nail, initial encounter  Tetanus-diphtheria (Td) vaccination    Rx / DC Orders ED Discharge Orders     None         Arby Barrette, MD 01/07/22 1842

## 2022-01-07 NOTE — Patient Outreach (Signed)
  Care Coordination   01/07/2022 Name: Margaret Preston MRN: [id number] DOB: [birth year]   Care Coordination Outreach Attempts:  An unsuccessful telephone outreach was attempted today to offer the patient information about available care coordination services as a benefit of their health plan.   Follow Up Plan:  Additional outreach attempts will be made to offer the patient care coordination information and services.   Encounter Outcome:  No Answer- received automated outgoing message stating that patient's voice mail box has not been set up yet; unable to leave message requesting call back  Care Coordination Interventions Activated:  No   Care Coordination Interventions:  No, not indicated unsuccessful outreach attempt # 1   Oneta Rack, RN, BSN, CCRN Alumnus RN CM Care Coordination/ Transition of [place] Management [phone]: direct office

## 2022-01-07 NOTE — ED Notes (Signed)
Finger splint with dsg applied to left index finger, confirmed Derma bond had dried prior to dsg application. Pt teaching done while applying splint and dsg to client. Also provided information on observing for s/s of infection and the need to return to the ED or to follow up with her Primary Care MD. Opportunity for questions provided prior to DC to home with family.

## 2022-01-07 NOTE — ED Triage Notes (Signed)
Left index finger laceration, unloading dishwasher and cut finger on item. States had a lot of bleeding initially, bleeding is under control at this time

## 2022-01-12 ENCOUNTER — Telehealth: Payer: Self-pay | Admitting: *Deleted

## 2022-01-12 ENCOUNTER — Encounter: Payer: Self-pay | Admitting: *Deleted

## 2022-01-12 NOTE — Patient Outreach (Signed)
  Care Coordination   01/12/2022 Name: Margaret Preston MRN: [id number] DOB: 1960/12/15   Care Coordination Outreach Attempts:  A second unsuccessful outreach was attempted today to offer the patient with information about available care coordination services as a benefit of their health plan.     Follow Up Plan:  Additional outreach attempts will be made to offer the patient care coordination information and services.   Encounter Outcome:  No Answer Received automated outgoing voice message stating that the patient's phone has not been set up for voice mail- unable to leave voice message requesting call back  Care Coordination Interventions Activated:  No   Care Coordination Interventions:  No, not indicated unsuccessful outreach attempt # 2   Oneta Rack, RN, BSN, CCRN Alumnus RN CM Care Coordination/ Transition of [place] Management [phone]: direct office

## 2022-01-15 ENCOUNTER — Ambulatory Visit: Payer: Federal, State, Local not specified - PPO | Admitting: Internal Medicine

## 2022-01-15 ENCOUNTER — Encounter: Payer: Self-pay | Admitting: Internal Medicine

## 2022-01-15 VITALS — BP 118/74 | HR 74 | Temp 97.9°F | Ht 66.0 in | Wt 236.4 lb

## 2022-01-15 DIAGNOSIS — E538 Deficiency of other specified B group vitamins: Secondary | ICD-10-CM | POA: Diagnosis not present

## 2022-01-15 MED ORDER — LORAZEPAM 2 MG PO TABS: ORAL_TABLET | ORAL | 1 refills | Status: DC

## 2022-01-15 MED ORDER — FLUOXETINE HCL 40 MG PO CAPS: 40.0000 mg | ORAL_CAPSULE | Freq: Every day | ORAL | 1 refills | Status: DC

## 2022-01-15 NOTE — Progress Notes (Signed)
Subjective:  Patient ID: Margaret Preston, female    DOB: 23-Mar-1960  Age: 61 y.o. MRN: [id number]  CC: Follow-up (ER follow-up)   HPI Rosellen Lichtenberger presents for depression, anxiety F/u L index finger   Outpatient Medications Prior to Visit  Medication Sig Dispense Refill   albuterol (VENTOLIN HFA) 108 (90 Base) MCG/ACT inhaler Inhale 1-2 puffs into the lungs every 6 (six) hours as needed for wheezing or shortness of breath. 18 g 0   clotrimazole-betamethasone (LOTRISONE) cream Apply 1 application topically 2 (two) times daily. Use on rash 30 g 1   cyanocobalamin 1000 MCG tablet Take 0.5 tablets (500 mcg total) by mouth daily. 100 tablet 3   doxycycline (VIBRAMYCIN) 100 MG capsule Take 1 capsule (100 mg total) by mouth 2 (two) times daily. 20 capsule 0   predniSONE (DELTASONE) 10 MG tablet Take 2 tablets (20 mg total) by mouth daily. 20 tablet 0   FLUoxetine (PROZAC) 40 MG capsule TAKE 1 CAPSULE BY MOUTH EVERY DAY 90 capsule 3   LORazepam (ATIVAN) 2 MG tablet TAKE 1/2 TO 1 TABLET BY MOUTH TWICE A DAY AS NEEDED 60 tablet 1   No facility-administered medications prior to visit.    ROS: Review of Systems  Constitutional:  Negative for activity change, appetite change, chills, fatigue and unexpected weight change.  HENT:  Negative for congestion, mouth sores and sinus pressure.   Eyes:  Negative for visual disturbance.  Respiratory:  Negative for cough and chest tightness.   Gastrointestinal:  Negative for abdominal pain and nausea.  Genitourinary:  Negative for difficulty urinating, frequency and vaginal pain.  Musculoskeletal:  Negative for back pain and gait problem.  Skin:  Positive for wound. Negative for pallor and rash.  Neurological:  Negative for dizziness, tremors, weakness, numbness and headaches.  Psychiatric/Behavioral:  Negative for confusion and sleep disturbance.     Objective:  BP 118/74 (BP Location: Left Arm)   Pulse 74   Temp 97.9 F (36.6 C) (Oral)    Ht 5\' 6"  (1.676 m)   Wt 236 lb 6.4 oz (107.2 kg)   LMP 05/08/2014   SpO2 98%   BMI 38.16 kg/m   BP Readings from Last 3 Encounters:  01/15/22 118/74  01/07/22 127/65  06/27/21 132/78    Wt Readings from Last 3 Encounters:  01/15/22 236 lb 6.4 oz (107.2 kg)  06/27/21 240 lb (108.9 kg)  03/18/21 228 lb 9.6 oz (103.7 kg)    Physical Exam Constitutional:      General: She is not in acute distress.    Appearance: She is well-developed.  HENT:     Head: Normocephalic.     Right Ear: External ear normal.     Left Ear: External ear normal.     Nose: Nose normal.  Eyes:     General:        Right eye: No discharge.        Left eye: No discharge.     Conjunctiva/sclera: Conjunctivae normal.     Pupils: Pupils are equal, round, and reactive to light.  Neck:     Thyroid: No thyromegaly.     Vascular: No JVD.     Trachea: No tracheal deviation.  Cardiovascular:     Rate and Rhythm: Normal rate and regular rhythm.     Heart sounds: Normal heart sounds.  Pulmonary:     Effort: No respiratory distress.     Breath sounds: No stridor. No wheezing.  Abdominal:     General:  Bowel sounds are normal. There is no distension.     Palpations: Abdomen is soft. There is no mass.     Tenderness: There is no abdominal tenderness. There is no guarding or rebound.  Musculoskeletal:        General: No tenderness.     Cervical back: Normal range of motion and neck supple. No rigidity.  Lymphadenopathy:     Cervical: No cervical adenopathy.  Skin:    Findings: No erythema or rash.  Neurological:     Cranial Nerves: No cranial nerve deficit.     Motor: No abnormal muscle tone.     Coordination: Coordination normal.     Deep Tendon Reflexes: Reflexes normal.  Psychiatric:        Behavior: Behavior normal.        Thought Content: Thought content normal.        Judgment: Judgment normal.     Lab Results  Component Value Date   WBC 7.4 11/04/2020   HGB 11.8 (L) 11/04/2020   HCT 36.1  11/04/2020   PLT 416.0 (H) 11/04/2020   GLUCOSE 82 11/04/2020   CHOL 130 11/04/2020   TRIG 62.0 11/04/2020   HDL 58.20 11/04/2020   LDLCALC 60 11/04/2020   ALT 35 11/04/2020   AST 39 (H) 11/04/2020   NA 136 11/04/2020   K 3.7 11/04/2020   CL 103 11/04/2020   CREATININE 0.87 11/04/2020   BUN 12 11/04/2020   CO2 26 11/04/2020   TSH 3.02 11/04/2020   INR 1.1 (H) 11/04/2020   HGBA1C 5.9 02/28/2013    No results found.  Assessment & Plan:   Problem List Items Addressed This Visit     B12 deficiency - Primary    S/p gastric sleeve Risks associated with treatment noncompliance were discussed. Compliance was encouraged.         Meds ordered this encounter  Medications   FLUoxetine (PROZAC) 40 MG capsule    Sig: Take 1 capsule (40 mg total) by mouth daily.    Dispense:  90 capsule    Refill:  1   LORazepam (ATIVAN) 2 MG tablet    Sig: TAKE 1/2 TO 1 TABLET BY MOUTH TWICE A DAY AS NEEDED    Dispense:  60 tablet    Refill:  1    This request is for a new prescription for a controlled substance as required by Federal/State law..      Follow-up: Return in about 3 months (around 04/17/2022) for a follow-up visit.  Sonda Primes, MD

## 2022-01-15 NOTE — Assessment & Plan Note (Signed)
S/p gastric sleeve Risks associated with treatment noncompliance were discussed. Compliance was encouraged.

## 2022-01-20 ENCOUNTER — Encounter: Payer: Self-pay | Admitting: *Deleted

## 2022-01-20 ENCOUNTER — Telehealth: Payer: Self-pay | Admitting: *Deleted

## 2022-01-20 NOTE — Patient Outreach (Addendum)
  Care Coordination   01/20/2022 Name: Margaret Preston MRN: [id number] DOB: 02/16/1961   Care Coordination Outreach Attempts:  A third unsuccessful outreach was attempted today to offer the patient with information about available care coordination services as a benefit of their health plan.   Follow Up Plan:  No further outreach attempts will be made at this time. We have been unable to contact the patient to offer or enroll patient in care coordination services  Encounter Outcome:  No Answer Received automated outgoing voice message stating that "the person you are trying to call has a voice mail box that has not been set up yet;" Unable to leave voice message requesting call-back  Care Coordination Interventions Activated:  No   Care Coordination Interventions:  No, not indicated Unsuccessful outreach attempt # 3    Caryl Pina, RN, BSN, CCRN Alumnus RN CM Care Coordination/ Transition of Care- [facility] Care Management [phone]: direct office

## 2022-06-08 ENCOUNTER — Encounter: Payer: Self-pay | Admitting: Internal Medicine

## 2022-06-08 ENCOUNTER — Ambulatory Visit: Payer: Federal, State, Local not specified - PPO | Admitting: Internal Medicine

## 2022-06-08 VITALS — BP 108/66 | HR 47 | Temp 98.3°F | Ht 66.0 in | Wt 233.0 lb

## 2022-06-08 DIAGNOSIS — F411 Generalized anxiety disorder: Secondary | ICD-10-CM | POA: Diagnosis not present

## 2022-06-08 DIAGNOSIS — E559 Vitamin D deficiency, unspecified: Secondary | ICD-10-CM | POA: Diagnosis not present

## 2022-06-08 DIAGNOSIS — K7460 Unspecified cirrhosis of liver: Secondary | ICD-10-CM | POA: Diagnosis not present

## 2022-06-08 DIAGNOSIS — E538 Deficiency of other specified B group vitamins: Secondary | ICD-10-CM

## 2022-06-08 DIAGNOSIS — E038 Other specified hypothyroidism: Secondary | ICD-10-CM | POA: Diagnosis not present

## 2022-06-08 MED ORDER — LORAZEPAM 2 MG PO TABS: ORAL_TABLET | ORAL | 1 refills | Status: DC

## 2022-06-08 MED ORDER — FLUOXETINE HCL 40 MG PO CAPS: 40.0000 mg | ORAL_CAPSULE | Freq: Every day | ORAL | 1 refills | Status: DC

## 2022-06-08 NOTE — Patient Instructions (Signed)
Blue-Emu cream -- use 2-3 times a day ? ?

## 2022-06-08 NOTE — Assessment & Plan Note (Signed)
NASH vs ETOH related

## 2022-06-08 NOTE — Assessment & Plan Note (Signed)
Chronic S/p gastric sleeve

## 2022-06-08 NOTE — Assessment & Plan Note (Signed)
Monitor TSH, FT4 

## 2022-06-08 NOTE — Assessment & Plan Note (Signed)
Lorazepam prn  Potential benefits of a long term benzodiazepines  use as well as potential risks  and complications were explained to the patient and were aknowledged.  

## 2022-06-08 NOTE — Assessment & Plan Note (Signed)
OVit D q 1 mo

## 2022-06-08 NOTE — Progress Notes (Signed)
Subjective:  Patient ID: Margaret Preston, female    DOB: 1961/02/24  Age: 62 y.o. MRN: [id number]  CC: Medication Refill   HPI [name] presents for depression, anxiety, insomnia  Outpatient Medications Prior to Visit  Medication Sig Dispense Refill   albuterol (VENTOLIN HFA) 108 (90 Base) MCG/ACT inhaler Inhale 1-2 puffs into the lungs every 6 (six) hours as needed for wheezing or shortness of breath. 18 g 0   clotrimazole-betamethasone (LOTRISONE) cream Apply 1 application topically 2 (two) times daily. Use on rash 30 g 1   FLUoxetine (PROZAC) 40 MG capsule Take 1 capsule (40 mg total) by mouth daily. 90 capsule 1   ibuprofen (ADVIL) 600 MG tablet Take 600 mg by mouth every 6 (six) hours as needed.     LORazepam (ATIVAN) 2 MG tablet TAKE 1/2 TO 1 TABLET BY MOUTH TWICE A DAY AS NEEDED 60 tablet 1   predniSONE (DELTASONE) 10 MG tablet Take 2 tablets (20 mg total) by mouth daily. 20 tablet 0   cyanocobalamin 1000 MCG tablet Take 0.5 tablets (500 mcg total) by mouth daily. (Patient not taking: Reported on 06/08/2022) 100 tablet 3   doxycycline (VIBRAMYCIN) 100 MG capsule Take 1 capsule (100 mg total) by mouth 2 (two) times daily. (Patient not taking: Reported on 06/08/2022) 20 capsule 0   No facility-administered medications prior to visit.    ROS: Review of Systems  Constitutional:  Negative for activity change, appetite change, chills, fatigue and unexpected weight change.  HENT:  Negative for congestion, mouth sores and sinus pressure.   Eyes:  Negative for visual disturbance.  Respiratory:  Negative for cough and chest tightness.   Gastrointestinal:  Negative for abdominal pain and nausea.  Genitourinary:  Negative for difficulty urinating, frequency and vaginal pain.  Musculoskeletal:  Negative for back pain and gait problem.  Skin:  Negative for pallor and rash.  Neurological:  Negative for dizziness, tremors, weakness, numbness and headaches.  Psychiatric/Behavioral:   Positive for suicidal ideas. Negative for confusion and sleep disturbance. The patient is nervous/anxious.     Objective:  BP 108/66 (BP Location: Right Arm, Patient Position: Sitting, Cuff Size: Large)   Pulse (!) 47   Temp 98.3 F (36.8 C) (Oral)   Ht 5\' 6"  (1.676 m)   Wt 233 lb (105.7 kg)   LMP 05/08/2014   SpO2 97%   BMI 37.61 kg/m   BP Readings from Last 3 Encounters:  06/08/22 108/66  01/15/22 118/74  01/07/22 127/65    Wt Readings from Last 3 Encounters:  06/08/22 233 lb (105.7 kg)  01/15/22 236 lb 6.4 oz (107.2 kg)  06/27/21 240 lb (108.9 kg)    Physical Exam Constitutional:      General: She is not in acute distress.    Appearance: She is well-developed. She is obese.  HENT:     Head: Normocephalic.     Right Ear: External ear normal.     Left Ear: External ear normal.     Nose: Nose normal.  Eyes:     General:        Right eye: No discharge.        Left eye: No discharge.     Conjunctiva/sclera: Conjunctivae normal.     Pupils: Pupils are equal, round, and reactive to light.  Neck:     Thyroid: No thyromegaly.     Vascular: No JVD.     Trachea: No tracheal deviation.  Cardiovascular:     Rate and Rhythm: Normal  rate and regular rhythm.     Heart sounds: Normal heart sounds.  Pulmonary:     Effort: No respiratory distress.     Breath sounds: No stridor. No wheezing.  Abdominal:     General: Bowel sounds are normal. There is no distension.     Palpations: Abdomen is soft. There is no mass.     Tenderness: There is no abdominal tenderness. There is no guarding or rebound.  Musculoskeletal:        General: No tenderness.     Cervical back: Normal range of motion and neck supple. No rigidity.  Lymphadenopathy:     Cervical: No cervical adenopathy.  Skin:    Findings: No erythema or rash.  Neurological:     Cranial Nerves: No cranial nerve deficit.     Motor: No abnormal muscle tone.     Coordination: Coordination normal.     Deep Tendon  Reflexes: Reflexes normal.  Psychiatric:        Behavior: Behavior normal.        Thought Content: Thought content normal.        Judgment: Judgment normal.     Lab Results  Component Value Date   WBC 7.4 11/04/2020   HGB 11.8 (L) 11/04/2020   HCT 36.1 11/04/2020   PLT 416.0 (H) 11/04/2020   GLUCOSE 82 11/04/2020   CHOL 130 11/04/2020   TRIG 62.0 11/04/2020   HDL 58.20 11/04/2020   LDLCALC 60 11/04/2020   ALT 35 11/04/2020   AST 39 (H) 11/04/2020   NA 136 11/04/2020   K 3.7 11/04/2020   CL 103 11/04/2020   CREATININE 0.87 11/04/2020   BUN 12 11/04/2020   CO2 26 11/04/2020   TSH 3.02 11/04/2020   INR 1.1 (H) 11/04/2020   HGBA1C 5.9 02/28/2013    No results found.  Assessment & Plan:   Problem List Items Addressed This Visit       Digestive   Liver cirrhosis - Primary    NASH vs ETOH related        Endocrine   Hypothyroidism    Monitor TSH, FT4        Other   Vitamin D deficiency    OVit D q 1 mo      B12 deficiency    Chronic S/p gastric sleeve      Anxiety state    Lorazepam prn  Potential benefits of a long term benzodiazepines  use as well as potential risks  and complications were explained to the patient and were aknowledged.         No orders of the defined types were placed in this encounter.     Follow-up: Return in about 3 months (around 09/07/2022) for a follow-up visit.  Walker Kehr, MD

## 2022-09-21 ENCOUNTER — Other Ambulatory Visit: Payer: Federal, State, Local not specified - PPO

## 2022-09-21 DIAGNOSIS — E538 Deficiency of other specified B group vitamins: Secondary | ICD-10-CM | POA: Diagnosis not present

## 2022-09-21 DIAGNOSIS — K7460 Unspecified cirrhosis of liver: Secondary | ICD-10-CM

## 2022-09-21 DIAGNOSIS — E559 Vitamin D deficiency, unspecified: Secondary | ICD-10-CM | POA: Diagnosis not present

## 2022-09-21 LAB — CBC WITH DIFFERENTIAL/PLATELET
Basophils Absolute: 0 10*3/uL (ref 0.0–0.1)
Basophils Relative: 0.7 % (ref 0.0–3.0)
Eosinophils Absolute: 0.2 10*3/uL (ref 0.0–0.7)
Eosinophils Relative: 3 % (ref 0.0–5.0)
HCT: 39.1 % (ref 36.0–46.0)
Hemoglobin: 12.6 g/dL (ref 12.0–15.0)
Lymphocytes Relative: 59.3 % — ABNORMAL HIGH (ref 12.0–46.0)
Lymphs Abs: 3.7 10*3/uL (ref 0.7–4.0)
MCHC: 32.3 g/dL (ref 30.0–36.0)
MCV: 88.7 fl (ref 78.0–100.0)
Monocytes Absolute: 0.6 10*3/uL (ref 0.1–1.0)
Monocytes Relative: 9.1 % (ref 3.0–12.0)
Neutro Abs: 1.8 10*3/uL (ref 1.4–7.7)
Neutrophils Relative %: 27.9 % — ABNORMAL LOW (ref 43.0–77.0)
Platelets: 406 10*3/uL — ABNORMAL HIGH (ref 150.0–400.0)
RBC: 4.41 Mil/uL (ref 3.87–5.11)
RDW: 14.6 % (ref 11.5–15.5)
WBC: 6.3 10*3/uL (ref 4.0–10.5)

## 2022-09-21 LAB — COMPREHENSIVE METABOLIC PANEL
ALT: 20 U/L (ref 0–35)
AST: 24 U/L (ref 0–37)
Albumin: 3.6 g/dL (ref 3.5–5.2)
Alkaline Phosphatase: 149 U/L — ABNORMAL HIGH (ref 39–117)
BUN: 13 mg/dL (ref 6–23)
CO2: 26 mEq/L (ref 19–32)
Calcium: 9.6 mg/dL (ref 8.4–10.5)
Chloride: 106 mEq/L (ref 96–112)
Creatinine, Ser: 0.79 mg/dL (ref 0.40–1.20)
GFR: 79.68 mL/min (ref >60.00)
Glucose, Bld: 93 mg/dL (ref 70–99)
Potassium: 3.7 mEq/L (ref 3.5–5.1)
Sodium: 139 mEq/L (ref 135–145)
Total Bilirubin: 0.6 mg/dL (ref 0.2–1.2)
Total Protein: 7.9 g/dL (ref 6.0–8.3)

## 2022-09-21 LAB — URINALYSIS
Bilirubin Urine: NEGATIVE
Hgb urine dipstick: NEGATIVE
Ketones, ur: NEGATIVE
Leukocytes,Ua: NEGATIVE
Nitrite: NEGATIVE
Specific Gravity, Urine: 1.025 (ref 1.000–1.030)
Total Protein, Urine: NEGATIVE
Urine Glucose: NEGATIVE
Urobilinogen, UA: 0.2 (ref 0.0–1.0)
pH: 6 (ref 5.0–8.0)

## 2022-09-21 LAB — LIPID PANEL
Cholesterol: 138 mg/dL (ref 0–200)
HDL: 56.4 mg/dL (ref >39.00)
LDL Cholesterol: 72 mg/dL (ref 0–99)
NonHDL: 81.53
Total CHOL/HDL Ratio: 2
Triglycerides: 46 mg/dL (ref 0.0–149.0)
VLDL: 9.2 mg/dL (ref 0.0–40.0)

## 2022-09-21 LAB — TSH: TSH: 13.12 u[IU]/mL — ABNORMAL HIGH (ref 0.35–5.50)

## 2022-09-21 LAB — VITAMIN D 25 HYDROXY (VIT D DEFICIENCY, FRACTURES): VITD: 44.45 ng/mL (ref 30.00–100.00)

## 2022-09-21 LAB — VITAMIN B12: Vitamin B-12: >1500 pg/mL — ABNORMAL HIGH (ref 211–911)

## 2022-09-22 ENCOUNTER — Other Ambulatory Visit (INDEPENDENT_AMBULATORY_CARE_PROVIDER_SITE_OTHER): Payer: Federal, State, Local not specified - PPO

## 2022-09-22 DIAGNOSIS — E038 Other specified hypothyroidism: Secondary | ICD-10-CM

## 2022-09-22 LAB — T4, FREE: Free T4: 0.42 ng/dL — ABNORMAL LOW (ref 0.60–1.60)

## 2022-09-23 ENCOUNTER — Encounter: Payer: Self-pay | Admitting: Internal Medicine

## 2022-09-24 ENCOUNTER — Ambulatory Visit: Payer: Federal, State, Local not specified - PPO | Admitting: Internal Medicine

## 2022-09-24 ENCOUNTER — Encounter: Payer: Self-pay | Admitting: Internal Medicine

## 2022-09-24 VITALS — BP 122/70 | HR 69 | Temp 98.6°F | Ht 66.0 in | Wt 230.0 lb

## 2022-09-24 DIAGNOSIS — N952 Postmenopausal atrophic vaginitis: Secondary | ICD-10-CM | POA: Diagnosis not present

## 2022-09-24 DIAGNOSIS — E559 Vitamin D deficiency, unspecified: Secondary | ICD-10-CM | POA: Diagnosis not present

## 2022-09-24 DIAGNOSIS — E538 Deficiency of other specified B group vitamins: Secondary | ICD-10-CM

## 2022-09-24 DIAGNOSIS — E038 Other specified hypothyroidism: Secondary | ICD-10-CM

## 2022-09-24 MED ORDER — LEVOTHYROXINE SODIUM 25 MCG PO TABS: 25.0000 ug | ORAL_TABLET | Freq: Every day | ORAL | 3 refills | Status: DC

## 2022-09-24 MED ORDER — PREMARIN 0.625 MG/GM VA CREA: 1.0000 | TOPICAL_CREAM | Freq: Every day | VAGINAL | 5 refills | Status: AC

## 2022-09-24 NOTE — Assessment & Plan Note (Signed)
Premarin vaginal cream.

## 2022-09-24 NOTE — Assessment & Plan Note (Signed)
On B12 Reduce dose in half

## 2022-09-24 NOTE — Progress Notes (Unsigned)
Subjective:  Patient ID: Margaret Preston, female    DOB: 1960-11-07  Age: 62 y.o. MRN: [id number]  CC: Dizziness (DIZZINESS X1 mnth, insomnia, blurry vision, brain fog, extremely cold feet, fatigue, anxiety, general malaise, headache and diarrhea...  Vaginal dryness a/w itch. Fullness in throat feeling)   HPI Margaret Preston presents for brain fog, nausea, itchiness, fullness in the stomach for several months. Feeling cold at night...  Outpatient Medications Prior to Visit  Medication Sig Dispense Refill  . FLUoxetine (PROZAC) 40 MG capsule Take 1 capsule (40 mg total) by mouth daily. 90 capsule 1  . ibuprofen (ADVIL) 600 MG tablet Take 600 mg by mouth every 6 (six) hours as needed.    Marland Kitchen LORazepam (ATIVAN) 2 MG tablet TAKE 1/2 TO 1 TABLET BY MOUTH TWICE A DAY AS NEEDED 60 tablet 1  . cyanocobalamin 1000 MCG tablet Take 0.5 tablets (500 mcg total) by mouth daily. (Patient not taking: Reported on 06/08/2022) 100 tablet 3  . albuterol (VENTOLIN HFA) 108 (90 Base) MCG/ACT inhaler Inhale 1-2 puffs into the lungs every 6 (six) hours as needed for wheezing or shortness of breath. 18 g 0   No facility-administered medications prior to visit.    ROS: Review of Systems  Constitutional:  Positive for fatigue and unexpected weight change. Negative for activity change, appetite change and chills.  HENT:  Negative for congestion, mouth sores and sinus pressure.   Eyes:  Negative for visual disturbance.  Respiratory:  Negative for cough, chest tightness and wheezing.   Cardiovascular:  Negative for palpitations and leg swelling.  Gastrointestinal:  Positive for abdominal distention and nausea. Negative for abdominal pain, blood in stool and vomiting.  Genitourinary:  Negative for difficulty urinating, frequency and vaginal pain.  Musculoskeletal:  Negative for back pain and gait problem.  Skin:  Negative for pallor and rash.  Neurological:  Negative for dizziness, tremors, weakness, numbness and  headaches.  Psychiatric/Behavioral:  Positive for decreased concentration. Negative for confusion, sleep disturbance and suicidal ideas. The patient is nervous/anxious.     Objective:  BP 122/70 (BP Location: Right Arm, Patient Position: Sitting, Cuff Size: Large)   Pulse 69   Temp 98.6 F (37 C) (Oral)   Ht 5\' 6"  (1.676 m)   Wt 230 lb (104.3 kg)   LMP 05/08/2014   SpO2 98%   BMI 37.12 kg/m   BP Readings from Last 3 Encounters:  09/24/22 122/70  06/08/22 108/66  01/15/22 118/74    Wt Readings from Last 3 Encounters:  09/24/22 230 lb (104.3 kg)  06/08/22 233 lb (105.7 kg)  01/15/22 236 lb 6.4 oz (107.2 kg)    Physical Exam Constitutional:      General: She is not in acute distress.    Appearance: She is well-developed. She is obese.  HENT:     Head: Normocephalic.     Right Ear: External ear normal.     Left Ear: External ear normal.     Nose: Nose normal.  Eyes:     General:        Right eye: No discharge.        Left eye: No discharge.     Conjunctiva/sclera: Conjunctivae normal.     Pupils: Pupils are equal, round, and reactive to light.  Neck:     Thyroid: No thyromegaly.     Vascular: No JVD.     Trachea: No tracheal deviation.  Cardiovascular:     Rate and Rhythm: Normal rate and regular rhythm.  Heart sounds: Normal heart sounds.  Pulmonary:     Effort: No respiratory distress.     Breath sounds: No stridor. No wheezing.  Abdominal:     General: Bowel sounds are normal. There is no distension.     Palpations: Abdomen is soft. There is no mass.     Tenderness: There is no abdominal tenderness. There is no guarding or rebound.  Musculoskeletal:        General: No tenderness.     Cervical back: Normal range of motion and neck supple. No rigidity.  Lymphadenopathy:     Cervical: No cervical adenopathy.  Skin:    Findings: No erythema or rash.  Neurological:     Cranial Nerves: No cranial nerve deficit.     Motor: No abnormal muscle tone.      Coordination: Coordination normal.     Deep Tendon Reflexes: Reflexes normal.  Psychiatric:        Behavior: Behavior normal.        Thought Content: Thought content normal.        Judgment: Judgment normal.    Lab Results  Component Value Date   WBC 6.3 09/21/2022   HGB 12.6 09/21/2022   HCT 39.1 09/21/2022   PLT 406.0 (H) 09/21/2022   GLUCOSE 93 09/21/2022   CHOL 138 09/21/2022   TRIG 46.0 09/21/2022   HDL 56.40 09/21/2022   LDLCALC 72 09/21/2022   ALT 20 09/21/2022   AST 24 09/21/2022   NA 139 09/21/2022   K 3.7 09/21/2022   CL 106 09/21/2022   CREATININE 0.79 09/21/2022   BUN 13 09/21/2022   CO2 26 09/21/2022   TSH 13.12 (H) 09/21/2022   INR 1.1 (H) 11/04/2020   HGBA1C 5.9 02/28/2013    No results found.  Assessment & Plan:   Problem List Items Addressed This Visit     Hypothyroidism    New 09/2022 Start Synthroid 25 mcg/d      Relevant Medications   levothyroxine (SYNTHROID) 25 MCG tablet   B12 deficiency - Primary    On B12 Reduce dose in half      Vitamin D deficiency    Chronic Cont on Vit D- monthly      Atrophic vaginitis    Premarin vaginal cream      Relevant Medications   conjugated estrogens (PREMARIN) vaginal cream      Meds ordered this encounter  Medications  . conjugated estrogens (PREMARIN) vaginal cream    Sig: Place 1 Applicatorful vaginally daily.    Dispense:  42.5 g    Refill:  5  . levothyroxine (SYNTHROID) 25 MCG tablet    Sig: Take 1 tablet (25 mcg total) by mouth daily.    Dispense:  30 tablet    Refill:  3      Follow-up: Return in about 6 weeks (around 11/05/2022) for a follow-up visit.  Sonda Primes, MD

## 2022-09-24 NOTE — Assessment & Plan Note (Signed)
New 09/2022 The patient's multiple symptoms are likely related to new onset hypothyroidism.  Disease etiology, onset, progression were discussed with the patient. Start Synthroid 25 mcg/d

## 2022-09-24 NOTE — Assessment & Plan Note (Signed)
Chronic Cont on Vit D- monthly

## 2022-09-28 ENCOUNTER — Encounter: Payer: Self-pay | Admitting: Internal Medicine

## 2022-09-30 ENCOUNTER — Ambulatory Visit: Payer: Federal, State, Local not specified - PPO | Admitting: Internal Medicine

## 2022-10-07 ENCOUNTER — Encounter (INDEPENDENT_AMBULATORY_CARE_PROVIDER_SITE_OTHER): Payer: Self-pay

## 2022-11-04 ENCOUNTER — Other Ambulatory Visit (INDEPENDENT_AMBULATORY_CARE_PROVIDER_SITE_OTHER): Payer: Federal, State, Local not specified - PPO

## 2022-11-04 DIAGNOSIS — E038 Other specified hypothyroidism: Secondary | ICD-10-CM

## 2022-11-04 LAB — T4, FREE: Free T4: 0.68 ng/dL (ref 0.60–1.60)

## 2022-11-04 LAB — TSH: TSH: 5.26 u[IU]/mL (ref 0.35–5.50)

## 2022-11-04 IMAGING — US US ABDOMEN LIMITED
1 series · 14 of 25 positions shown · non-contrast
Comparison: CT abdomen pelvis 06/23/2017

CLINICAL DATA: Cirrhosis follow-up

EXAM:
ULTRASOUND ABDOMEN LIMITED RIGHT UPPER QUADRANT

[Series 1: us abdomen limited · 0.23mm/px · 14 of 41 slices shown]
[im 1/41]
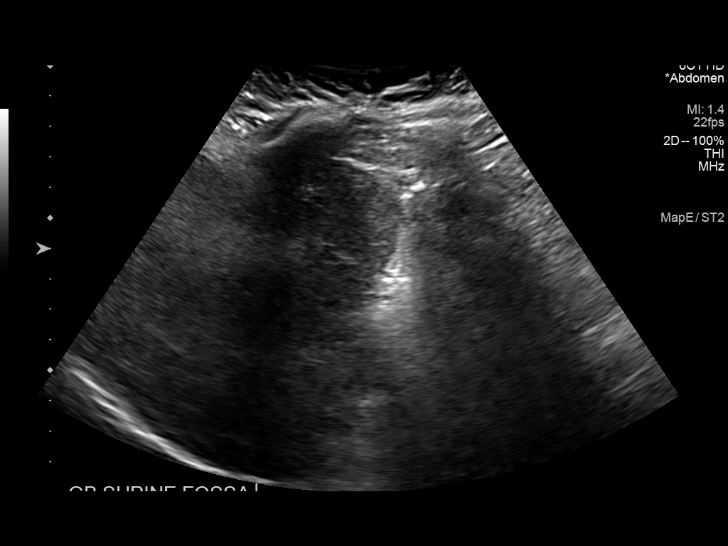
[im 4/41]
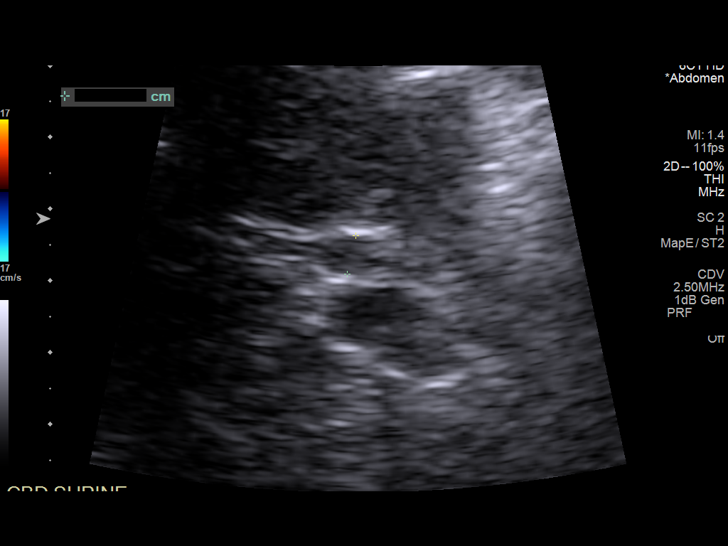
[im 7/41]
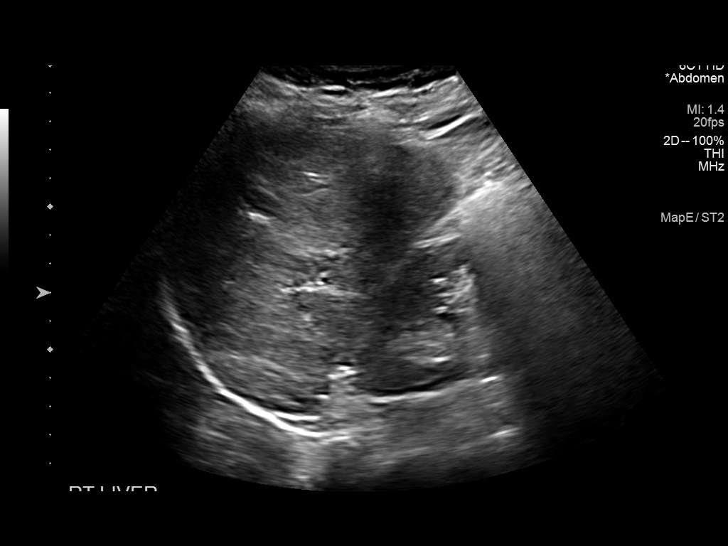
[im 11/41]
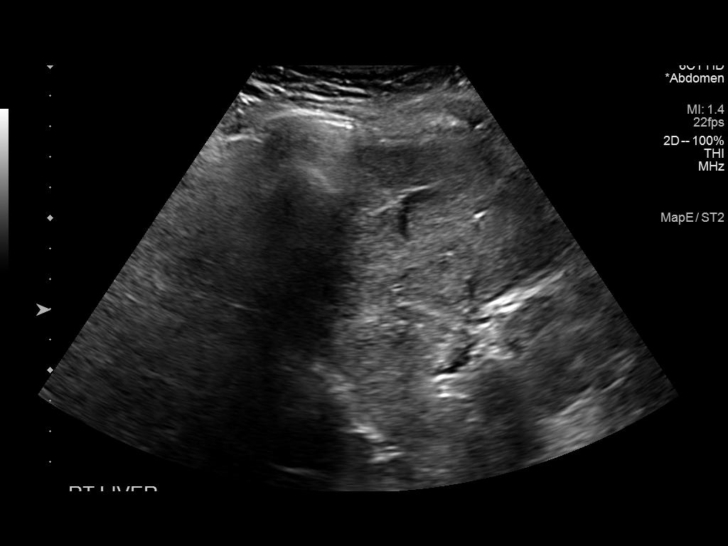
[im 14/41]
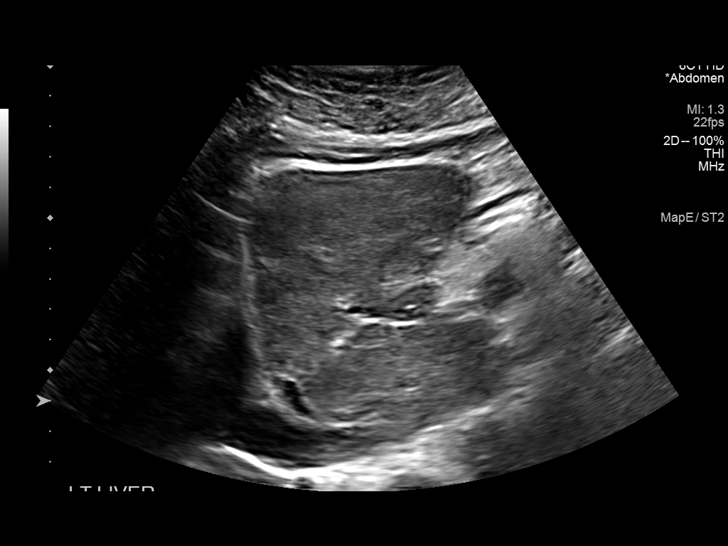
[im 16/41]
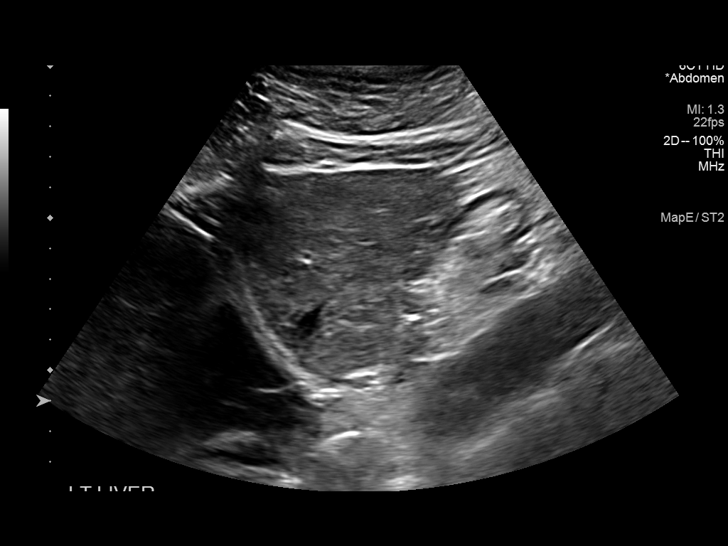
[im 19/41]
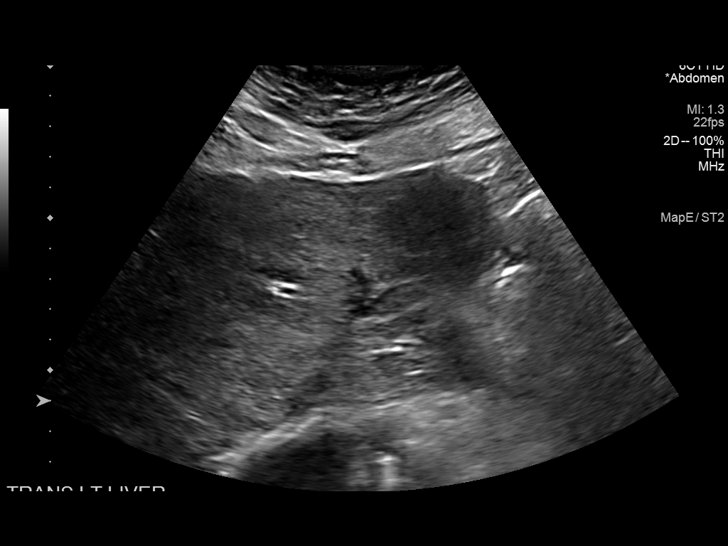
[im 22/41]
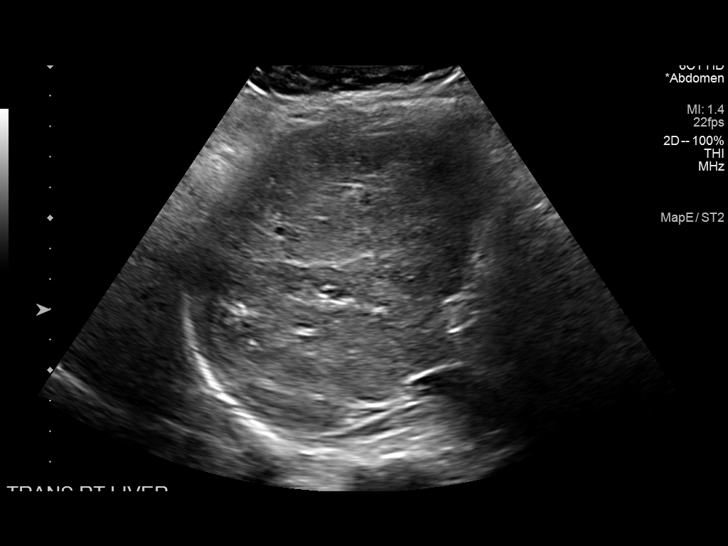
[im 26/41]
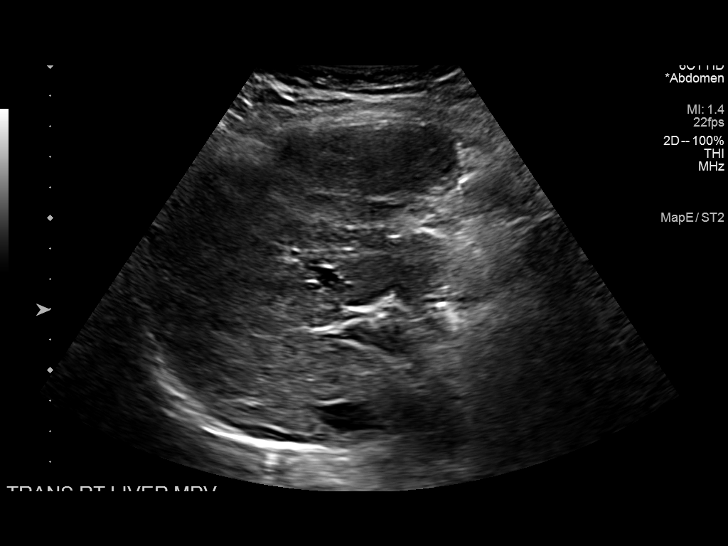
[im 27/41]
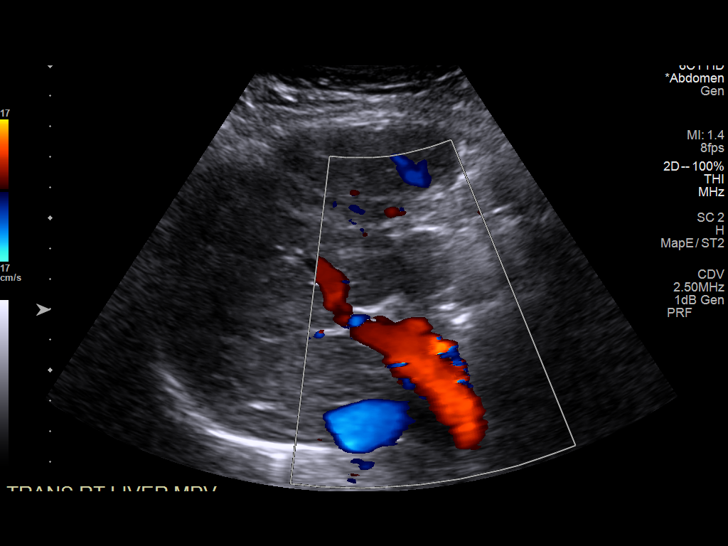
[im 31/41]
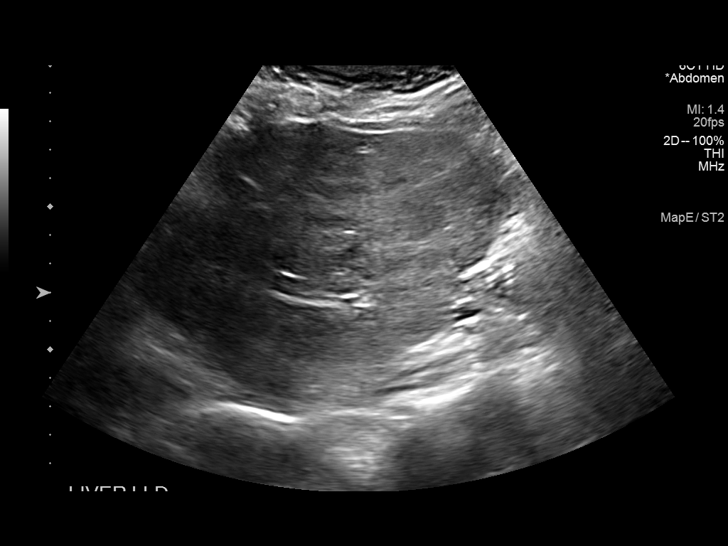
[im 34/41]
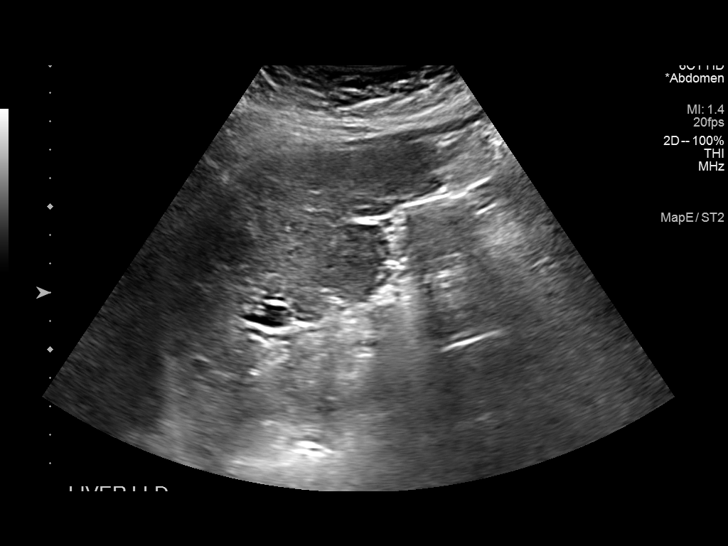
[im 37/41]
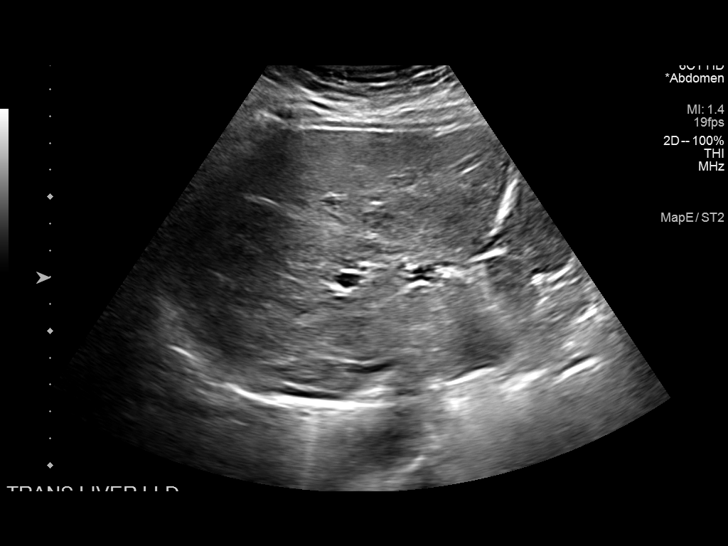
[im 41/41]
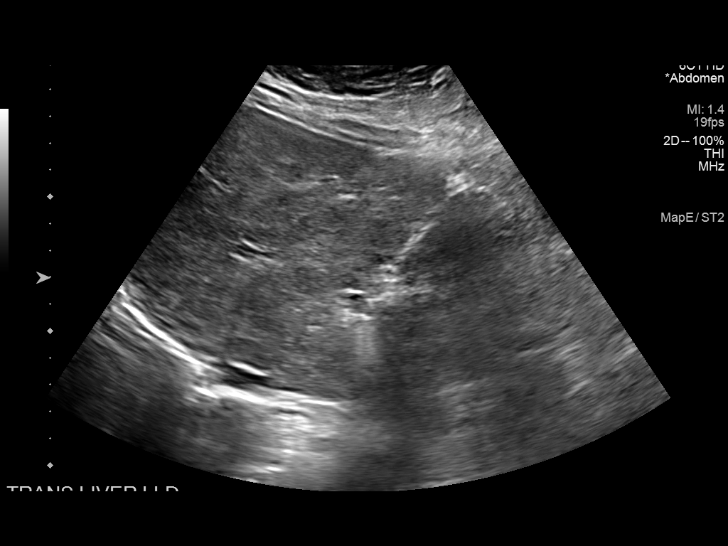

[14 of 25 positions shown; findings below may reference images not displayed]

FINDINGS: Gallbladder:

Surgically absent.

Common bile duct:

Diameter: 0.6 cm, within normal limits

Liver:

No focal lesion identified. Normal parenchymal echogenicity.
Coarsened and heterogeneous echotexture. Contour nodularity. Portal
vein is patent on color Doppler imaging with normal direction of
blood flow towards the liver.

Other: None.
IMPRESSION: Appearance of the liver in keeping with history of cirrhosis. No new
definite focal lesion though study is somewhat limited by shadowing
bowel gas.

## 2022-11-05 ENCOUNTER — Ambulatory Visit: Payer: Federal, State, Local not specified - PPO | Admitting: Internal Medicine

## 2022-11-05 ENCOUNTER — Encounter: Payer: Self-pay | Admitting: Internal Medicine

## 2022-11-05 VITALS — BP 110/60 | HR 55 | Temp 97.4°F | Ht 66.0 in | Wt 235.0 lb

## 2022-11-05 DIAGNOSIS — K047 Periapical abscess without sinus: Secondary | ICD-10-CM | POA: Diagnosis not present

## 2022-11-05 DIAGNOSIS — E038 Other specified hypothyroidism: Secondary | ICD-10-CM

## 2022-11-05 DIAGNOSIS — F419 Anxiety disorder, unspecified: Secondary | ICD-10-CM | POA: Diagnosis not present

## 2022-11-05 MED ORDER — LORAZEPAM 2 MG PO TABS: ORAL_TABLET | ORAL | 1 refills | Status: DC

## 2022-11-05 MED ORDER — LEVOTHYROXINE SODIUM 50 MCG PO TABS: 50.0000 ug | ORAL_TABLET | Freq: Every day | ORAL | 3 refills | Status: DC

## 2022-11-05 NOTE — Assessment & Plan Note (Signed)
S/p extraction earlier today

## 2022-11-05 NOTE — Progress Notes (Signed)
Subjective:  Patient ID: Margaret Preston, female    DOB: June 17, 1960  Age: 62 y.o. MRN: [id number]  CC: Follow-up (6 WEEK F/U, Discuss increase in thyroid meds)   HPI [name] presents for fatigue, hypothyroidism-doing better The patient had a tooth extraction procedure today, she is very tired  Outpatient Medications Prior to Visit  Medication Sig Dispense Refill   conjugated estrogens (PREMARIN) vaginal cream Place 1 Applicatorful vaginally daily. 42.5 g 5   FLUoxetine (PROZAC) 40 MG capsule Take 1 capsule (40 mg total) by mouth daily. 90 capsule 1   ibuprofen (ADVIL) 600 MG tablet Take 600 mg by mouth every 6 (six) hours as needed.     levothyroxine (SYNTHROID) 25 MCG tablet Take 1 tablet (25 mcg total) by mouth daily. 30 tablet 3   LORazepam (ATIVAN) 2 MG tablet TAKE 1/2 TO 1 TABLET BY MOUTH TWICE A DAY AS NEEDED 60 tablet 1   cyanocobalamin 1000 MCG tablet Take 0.5 tablets (500 mcg total) by mouth daily. (Patient not taking: Reported on 06/08/2022) 100 tablet 3   No facility-administered medications prior to visit.    ROS: Review of Systems  Constitutional:  Positive for fatigue. Negative for activity change, appetite change, chills and unexpected weight change.  HENT:  Positive for mouth sores. Negative for congestion and sinus pressure.   Eyes:  Negative for visual disturbance.  Respiratory:  Negative for cough and chest tightness.   Gastrointestinal:  Negative for abdominal pain and nausea.  Genitourinary:  Negative for difficulty urinating, frequency and vaginal pain.  Musculoskeletal:  Negative for back pain and gait problem.  Skin:  Negative for pallor and rash.  Neurological:  Negative for dizziness, tremors, weakness, numbness and headaches.  Psychiatric/Behavioral:  Negative for confusion and sleep disturbance.     Objective:  BP 110/60 (BP Location: Left Arm, Patient Position: Sitting, Cuff Size: Large)   Pulse (!) 55   Temp (!) 97.4 F (36.3 C)  (Oral)   Ht 5\' 6"  (1.676 m)   Wt 235 lb (106.6 kg)   LMP 05/08/2014   SpO2 97%   BMI 37.93 kg/m   BP Readings from Last 3 Encounters:  11/05/22 110/60  09/24/22 122/70  06/08/22 108/66    Wt Readings from Last 3 Encounters:  11/05/22 235 lb (106.6 kg)  09/24/22 230 lb (104.3 kg)  06/08/22 233 lb (105.7 kg)    Physical Exam Constitutional:      General: She is in acute distress.     Appearance: She is well-developed. She is obese. She is not toxic-appearing.  HENT:     Head: Normocephalic.     Right Ear: External ear normal.     Left Ear: External ear normal.     Nose: Nose normal.  Eyes:     General:        Right eye: No discharge.        Left eye: No discharge.     Conjunctiva/sclera: Conjunctivae normal.     Pupils: Pupils are equal, round, and reactive to light.  Neck:     Thyroid: No thyromegaly.     Vascular: No JVD.     Trachea: No tracheal deviation.  Cardiovascular:     Rate and Rhythm: Normal rate and regular rhythm.     Heart sounds: Normal heart sounds.  Pulmonary:     Effort: No respiratory distress.     Breath sounds: No stridor. No wheezing.  Abdominal:     General: Bowel sounds are normal. There  is no distension.     Palpations: Abdomen is soft. There is no mass.     Tenderness: There is no abdominal tenderness. There is no guarding or rebound.  Musculoskeletal:        General: No tenderness.     Cervical back: Normal range of motion and neck supple. No rigidity.  Lymphadenopathy:     Cervical: No cervical adenopathy.  Skin:    Findings: No erythema or rash.  Neurological:     Cranial Nerves: No cranial nerve deficit.     Motor: No abnormal muscle tone.     Coordination: Coordination normal.     Deep Tendon Reflexes: Reflexes normal.  Psychiatric:        Behavior: Behavior normal.        Thought Content: Thought content normal.        Judgment: Judgment normal.    She appears tired.  Right cheek is swollen. Lab Results  Component  Value Date   WBC 6.3 09/21/2022   HGB 12.6 09/21/2022   HCT 39.1 09/21/2022   PLT 406.0 (H) 09/21/2022   GLUCOSE 93 09/21/2022   CHOL 138 09/21/2022   TRIG 46.0 09/21/2022   HDL 56.40 09/21/2022   LDLCALC 72 09/21/2022   ALT 20 09/21/2022   AST 24 09/21/2022   NA 139 09/21/2022   K 3.7 09/21/2022   CL 106 09/21/2022   CREATININE 0.79 09/21/2022   BUN 13 09/21/2022   CO2 26 09/21/2022   TSH 5.26 11/04/2022   INR 1.1 (H) 11/04/2020   HGBA1C 5.9 02/28/2013    No results found.  Assessment & Plan:   Problem List Items Addressed This Visit     Hypothyroidism - Primary    Doing better will increase Synthroid to 50 mcg daily.  Labs in a few weeks      Relevant Medications   levothyroxine (SYNTHROID) 50 MCG tablet   Other Relevant Orders   T4, free   T3, free   TSH   ABSCESS, TOOTH    S/p extraction earlier today      Anxiety    Chronic.  Lorazepam as needed Continue fluoxetine      Relevant Medications   LORazepam (ATIVAN) 2 MG tablet      Meds ordered this encounter  Medications   DISCONTD: LORazepam (ATIVAN) 2 MG tablet    Sig: TAKE 1/2 TO 1 TABLET BY MOUTH TWICE A DAY AS NEEDED    Dispense:  60 tablet    Refill:  1    This request is for a new prescription for a controlled substance as required by Federal/State law..   levothyroxine (SYNTHROID) 50 MCG tablet    Sig: Take 1 tablet (50 mcg total) by mouth daily.    Dispense:  90 tablet    Refill:  3   LORazepam (ATIVAN) 2 MG tablet    Sig: TAKE 1/2 TO 1 TABLET BY MOUTH TWICE A DAY AS NEEDED    Dispense:  60 tablet    Refill:  1      Follow-up: Return in about 3 months (around 02/05/2023) for a follow-up visit.  Sonda Primes, MD

## 2022-11-12 NOTE — Assessment & Plan Note (Signed)
Chronic.  Lorazepam as needed Continue fluoxetine

## 2022-11-12 NOTE — Assessment & Plan Note (Signed)
Doing better will increase Synthroid to 50 mcg daily.  Labs in a few weeks

## 2023-01-01 ENCOUNTER — Other Ambulatory Visit: Payer: Self-pay | Admitting: Internal Medicine

## 2023-02-08 ENCOUNTER — Ambulatory Visit: Payer: Federal, State, Local not specified - PPO | Admitting: Internal Medicine

## 2023-02-11 ENCOUNTER — Ambulatory Visit
Admission: RE | Admit: 2023-02-11 | Discharge: 2023-02-11 | Disposition: A | Discharge status: Home / Self Care | Payer: Federal, State, Local not specified - PPO | Source: Ambulatory Visit | Attending: Internal Medicine | Admitting: Internal Medicine

## 2023-02-11 ENCOUNTER — Other Ambulatory Visit: Payer: Self-pay | Admitting: Internal Medicine

## 2023-02-11 DIAGNOSIS — Z1231 Encounter for screening mammogram for malignant neoplasm of breast: Secondary | ICD-10-CM

## 2023-02-25 ENCOUNTER — Other Ambulatory Visit: Payer: Self-pay | Admitting: Internal Medicine

## 2023-02-26 NOTE — Telephone Encounter (Signed)
Copied from CRM [id number]. Topic: Clinical - Medication Refill >> Feb 26, 2023  8:48 AM Herbert Seta B wrote:  Most Recent Primary Care Visit:  Provider: Tresa Garter  Department: [facility]  Visit Type: OFFICE VISIT  Date: 11/05/2022  Medication:  LORazepam (ATIVAN) 2 MG tablet    Has the patient contacted their pharmacy? Yes-PCP needs to send (Agent: If no, request that the patient contact the pharmacy for the refill. If patient does not wish to contact the pharmacy document the reason why and proceed with request.) (Agent: If yes, when and what did the pharmacy advise?)  Is this the correct pharmacy for this prescription? yes If no, delete pharmacy and type the correct one.  This is the patient's preferred pharmacy:  [facility] [phone] [place], [place] - [address] [place] [address] Phone: [phone] Fax: [phone]   Has the prescription been filled recently? yes  Is the patient out of the medication? yes  Has the patient been seen for an appointment in the last year OR does the patient have an upcoming appointment? yes  Can we respond through MyChart? yes  Agent: Please be advised that Rx refills may take up to 3 business days. We ask that you follow-up with your pharmacy.

## 2023-03-08 ENCOUNTER — Ambulatory Visit: Payer: Federal, State, Local not specified - PPO | Admitting: Internal Medicine

## 2023-05-04 ENCOUNTER — Ambulatory Visit: Payer: Self-pay | Admitting: Internal Medicine

## 2023-05-04 NOTE — Telephone Encounter (Signed)
 Chief Complaint: Back pain Symptoms: Radiates from right shoulder blade to lower back Frequency: Intermittent Pertinent Negatives: Patient denies weakness, numbness, or problems with bowel/bladder control, abdomen pain, blood in stool or urine Disposition: [] ED /[] Urgent Care (no appt availability in office) / [x] Appointment(In office/virtual)/ []  [facility]/ [] Home Care/ [] Refused Recommended Disposition /[] [place]/ []  Follow-up with PCP Additional Notes: Pt has had right back pain for three weeks. The pain is intermittent. Pt scheduled for an appointment with PCP on 05/07/2023. This RN educated pt on home care, new-worsening symptoms, when to call back/seek emergent care. Pt verbalized understanding and agrees to plan.  Copied from CRM [id number]. Topic: Clinical - Red Word Triage >> May 04, 2023  3:27 PM Corin V wrote: [facility] that prompted transfer to Nurse Triage: Shoulder blades into lower back. Moderate pain, not constant. Rating 5/10. Worried it may be her liver/kidney. Reason for Disposition  [1] MODERATE back pain (e.g., interferes with normal activities) AND [2] present > 3 days  Answer Assessment - Initial Assessment Questions 1. ONSET: "When did the pain begin?"      3 weeks 2. LOCATION: "Where does it hurt?" (upper, mid or lower back)    Right shoulder blade into lower back 3. SEVERITY: "How bad is the pain?"  (e.g., Scale 1-10; mild, moderate, or severe)   - MILD (1-3): Doesn't interfere with normal activities.    - MODERATE (4-7): Interferes with normal activities or awakens from sleep.    - SEVERE (8-10): Excruciating pain, unable to do any normal activities.      5/10 4. PATTERN: "Is the pain constant?" (e.g., yes, no; constant, intermittent)      Intermittent 5. RADIATION: "Does the pain shoot into your legs or somewhere else?"     Shoulder blade into lower back 6. CAUSE:  "What do you think is causing the back pain?"      Not sure 7.  MEDICINES: "What have you taken so far for the pain?" (e.g., nothing, acetaminophen, NSAIDS)     Motrin and Tylenol does not help with pain 9. NEUROLOGIC SYMPTOMS: "Do you have any weakness, numbness, or problems with bowel/bladder control?"     Denies 10. OTHER SYMPTOMS: "Do you have any other symptoms?" (e.g., fever, abdomen pain, burning with urination, blood in urine)       Denies  Protocols used: Back Pain-A-AH

## 2023-05-07 ENCOUNTER — Ambulatory Visit (INDEPENDENT_AMBULATORY_CARE_PROVIDER_SITE_OTHER): Payer: Federal, State, Local not specified - PPO

## 2023-05-07 ENCOUNTER — Ambulatory Visit: Payer: Federal, State, Local not specified - PPO | Admitting: Internal Medicine

## 2023-05-07 ENCOUNTER — Other Ambulatory Visit: Payer: Self-pay | Admitting: Internal Medicine

## 2023-05-07 ENCOUNTER — Encounter: Payer: Self-pay | Admitting: Internal Medicine

## 2023-05-07 VITALS — BP 120/80 | HR 86 | Temp 97.9°F | Ht 66.0 in | Wt 236.0 lb

## 2023-05-07 DIAGNOSIS — R0781 Pleurodynia: Secondary | ICD-10-CM | POA: Diagnosis not present

## 2023-05-07 DIAGNOSIS — R1011 Right upper quadrant pain: Secondary | ICD-10-CM

## 2023-05-07 DIAGNOSIS — E038 Other specified hypothyroidism: Secondary | ICD-10-CM

## 2023-05-07 DIAGNOSIS — E538 Deficiency of other specified B group vitamins: Secondary | ICD-10-CM

## 2023-05-07 DIAGNOSIS — R748 Abnormal levels of other serum enzymes: Secondary | ICD-10-CM

## 2023-05-07 DIAGNOSIS — K7460 Unspecified cirrhosis of liver: Secondary | ICD-10-CM

## 2023-05-07 DIAGNOSIS — R109 Unspecified abdominal pain: Secondary | ICD-10-CM | POA: Insufficient documentation

## 2023-05-07 LAB — COMPREHENSIVE METABOLIC PANEL
ALT: 22 U/L (ref 0–35)
AST: 28 U/L (ref 0–37)
Albumin: 3.7 g/dL (ref 3.5–5.2)
Alkaline Phosphatase: 131 U/L — ABNORMAL HIGH (ref 39–117)
BUN: 10 mg/dL (ref 6–23)
CO2: 28 meq/L (ref 19–32)
Calcium: 9.5 mg/dL (ref 8.4–10.5)
Chloride: 103 meq/L (ref 96–112)
Creatinine, Ser: 0.83 mg/dL (ref 0.40–1.20)
GFR: 74.77 mL/min (ref >60.00)
Glucose, Bld: 93 mg/dL (ref 70–99)
Potassium: 3.8 meq/L (ref 3.5–5.1)
Sodium: 139 meq/L (ref 135–145)
Total Bilirubin: 0.6 mg/dL (ref 0.2–1.2)
Total Protein: 8.1 g/dL (ref 6.0–8.3)

## 2023-05-07 LAB — T4, FREE: Free T4: 0.79 ng/dL (ref 0.60–1.60)

## 2023-05-07 LAB — TSH: TSH: 6.98 u[IU]/mL — ABNORMAL HIGH (ref 0.35–5.50)

## 2023-05-07 LAB — T3, FREE: T3, Free: 2.1 pg/mL — ABNORMAL LOW (ref 2.3–4.2)

## 2023-05-07 MED ORDER — HYDROCODONE-IBUPROFEN 5-200 MG PO TABS: 1.0000 | ORAL_TABLET | Freq: Three times a day (TID) | ORAL | 0 refills | Status: DC | PRN

## 2023-05-07 NOTE — Progress Notes (Signed)
 Subjective:  Patient ID: Margaret Preston, female    DOB: [birth year]  Age: 63 y.o. MRN: [id number]  CC: Back Pain (Lower rt sided abdomen and flank pain radiating into lower rt back, fatigue.)   HPI Page Brophy presents for R side pain - worse w/moving x 3 weeks  Outpatient Medications Prior to Visit  Medication Sig Dispense Refill   conjugated estrogens (PREMARIN) vaginal cream Place 1 Applicatorful vaginally daily. 42.5 g 5   FLUoxetine (PROZAC) 40 MG capsule Take 1 capsule (40 mg total) by mouth daily. 90 capsule 1   ibuprofen (ADVIL) 600 MG tablet Take 600 mg by mouth every 6 (six) hours as needed.     LORazepam (ATIVAN) 2 MG tablet TAKE 1/2 TO 1 TABLET BY MOUTH TWICE A DAY AS NEEDED 60 tablet 1   levothyroxine (SYNTHROID) 25 MCG tablet Take 1 tablet (25 mcg total) by mouth daily. 30 tablet 3   levothyroxine (SYNTHROID) 50 MCG tablet Take 1 tablet (50 mcg total) by mouth daily. 90 tablet 3   cyanocobalamin 1000 MCG tablet Take 0.5 tablets (500 mcg total) by mouth daily. (Patient not taking: Reported on 06/08/2022) 100 tablet 3   No facility-administered medications prior to visit.    ROS: Review of Systems  Constitutional:  Negative for activity change, appetite change, chills, fatigue and unexpected weight change.  HENT:  Negative for congestion, mouth sores and sinus pressure.   Eyes:  Negative for visual disturbance.  Respiratory:  Negative for cough and chest tightness.   Cardiovascular:  Negative for chest pain and palpitations.  Gastrointestinal:  Positive for abdominal pain. Negative for nausea, rectal pain and vomiting.  Genitourinary:  Negative for difficulty urinating, frequency and vaginal pain.  Musculoskeletal:  Positive for back pain. Negative for gait problem.  Skin:  Negative for pallor and rash.  Neurological:  Negative for dizziness, tremors, weakness, numbness and headaches.  Psychiatric/Behavioral:  Negative for confusion and sleep disturbance.      Objective:  BP 120/80   Pulse 86   Temp 97.9 F (36.6 C) (Oral)   Ht 5\' 6"  (1.676 m)   Wt 236 lb (107 kg)   LMP 05/08/2014   SpO2 99%   BMI 38.09 kg/m   BP Readings from Last 3 Encounters:  05/07/23 120/80  11/05/22 110/60  09/24/22 122/70    Wt Readings from Last 3 Encounters:  05/07/23 236 lb (107 kg)  11/05/22 235 lb (106.6 kg)  09/24/22 230 lb (104.3 kg)    Physical Exam Constitutional:      General: She is not in acute distress.    Appearance: She is well-developed. She is obese.  HENT:     Head: Normocephalic.     Right Ear: External ear normal.     Left Ear: External ear normal.     Nose: Nose normal.  Eyes:     General:        Right eye: No discharge.        Left eye: No discharge.     Conjunctiva/sclera: Conjunctivae normal.     Pupils: Pupils are equal, round, and reactive to light.  Neck:     Thyroid: No thyromegaly.     Vascular: No JVD.     Trachea: No tracheal deviation.  Cardiovascular:     Rate and Rhythm: Normal rate and regular rhythm.     Heart sounds: Normal heart sounds.  Pulmonary:     Effort: No respiratory distress.     Breath sounds: No stridor.  No wheezing.  Abdominal:     General: Bowel sounds are normal. There is no distension.     Palpations: Abdomen is soft. There is no mass.     Tenderness: There is no abdominal tenderness. There is no guarding or rebound.  Musculoskeletal:        General: No tenderness.     Cervical back: Normal range of motion and neck supple. No rigidity.  Lymphadenopathy:     Cervical: No cervical adenopathy.  Skin:    Findings: No erythema or rash.  Neurological:     Cranial Nerves: No cranial nerve deficit.     Motor: No abnormal muscle tone.     Coordination: Coordination normal.     Deep Tendon Reflexes: Reflexes normal.  Psychiatric:        Behavior: Behavior normal.        Thought Content: Thought content normal.        Judgment: Judgment normal.     Lab Results  Component Value  Date   WBC 6.3 09/21/2022   HGB 12.6 09/21/2022   HCT 39.1 09/21/2022   PLT 406.0 (H) 09/21/2022   GLUCOSE 93 05/07/2023   CHOL 138 09/21/2022   TRIG 46.0 09/21/2022   HDL 56.40 09/21/2022   LDLCALC 72 09/21/2022   ALT 22 05/07/2023   AST 28 05/07/2023   NA 139 05/07/2023   K 3.8 05/07/2023   CL 103 05/07/2023   CREATININE 0.83 05/07/2023   BUN 10 05/07/2023   CO2 28 05/07/2023   TSH 6.98 (H) 05/07/2023   INR 1.1 (H) 11/04/2020   HGBA1C 5.9 02/28/2013    MM 3D SCREENING MAMMOGRAM BILATERAL BREAST Result Date: 02/15/2023 CLINICAL DATA:  Screening. EXAM: DIGITAL SCREENING BILATERAL MAMMOGRAM WITH TOMOSYNTHESIS AND CAD TECHNIQUE: Bilateral screening digital craniocaudal and mediolateral oblique mammograms were obtained. Bilateral screening digital breast tomosynthesis was performed. The images were evaluated with computer-aided detection. COMPARISON:  Previous exam(s). ACR Breast Density Category b: There are scattered areas of fibroglandular density. FINDINGS: There are no findings suspicious for malignancy. IMPRESSION: No mammographic evidence of malignancy. A result letter of this screening mammogram will be mailed directly to the patient. RECOMMENDATION: Screening mammogram in one year. (Code:SM-B-01Y) BI-RADS CATEGORY  1: Negative. Electronically Signed   By: Frederico Hamman M.D.   On: 02/15/2023 15:02    Assessment & Plan:   Problem List Items Addressed This Visit     Hypothyroidism   Free T4 is low normal.  TSH is elevated.  Free T3 is decreased. Will increase levothyroxine to 75 mcg a day.  Repeat labs in 2-3 months      Relevant Medications   levothyroxine (SYNTHROID) 75 MCG tablet   Other Relevant Orders   CBC with Differential/Platelet   Comprehensive metabolic panel   T3, free   T4, free   TSH   B12 deficiency   Continue on B12      Elevated liver enzymes   Obtain liver function tests      Liver cirrhosis (HCC)   Continue to abstain from alcohol.   Obtain abdominal CT due to abdominal pain.  Obtain liver function test      Abdominal pain - Primary   Right-sided abdominal pain of unclear etiology.  Obtain abdominal/pelvis CT scan with contrast.  Obtain lab work including c-Met.  Obtain ribs x-ray on the right. Vicoprofen as needed pain  Potential benefits of a short term opioids use as well as potential risks (i.e. addiction risk, apnea etc) and complications (  i.e. Somnolence, constipation and others) were explained to the patient and were aknowledged.       Relevant Orders   DG Ribs Unilateral Right (Completed)   CT ABDOMEN PELVIS W CONTRAST   CBC with Differential/Platelet   Comprehensive metabolic panel   T3, free   T4, free   TSH      Meds ordered this encounter  Medications   hydrocodone-ibuprofen (VICOPROFEN) 5-200 MG tablet    Sig: Take 1 tablet by mouth every 8 (eight) hours as needed for pain.    Dispense:  15 tablet    Refill:  0   levothyroxine (SYNTHROID) 75 MCG tablet    Sig: Take 1 tablet (75 mcg total) by mouth daily.    Dispense:  30 tablet    Refill:  5      Follow-up: Return in about 1 week (around 05/14/2023) for a follow-up visit.  Sonda Primes, MD

## 2023-05-07 NOTE — Patient Instructions (Signed)
 Blue-Emu cream -- use 2-3 times a day ? ?

## 2023-05-08 ENCOUNTER — Encounter: Payer: Self-pay | Admitting: Internal Medicine

## 2023-05-08 MED ORDER — LEVOTHYROXINE SODIUM 75 MCG PO TABS: 75.0000 ug | ORAL_TABLET | Freq: Every day | ORAL | 5 refills | Status: DC

## 2023-05-08 NOTE — Assessment & Plan Note (Signed)
 Right-sided abdominal pain of unclear etiology.  Obtain abdominal/pelvis CT scan with contrast.  Obtain lab work including c-Met.  Obtain ribs x-ray on the right. Vicoprofen as needed pain  Potential benefits of a short term opioids use as well as potential risks (i.e. addiction risk, apnea etc) and complications (i.e. Somnolence, constipation and others) were explained to the patient and were aknowledged.

## 2023-05-08 NOTE — Assessment & Plan Note (Signed)
Continue on B12 

## 2023-05-08 NOTE — Assessment & Plan Note (Signed)
 Continue to abstain from alcohol.  Obtain abdominal CT due to abdominal pain.  Obtain liver function test

## 2023-05-08 NOTE — Assessment & Plan Note (Signed)
 Free T4 is low normal.  TSH is elevated.  Free T3 is decreased. Will increase levothyroxine to 75 mcg a day.  Repeat labs in 2-3 months

## 2023-05-08 NOTE — Assessment & Plan Note (Signed)
 Obtain liver function tests

## 2023-06-02 ENCOUNTER — Ambulatory Visit: Payer: Self-pay

## 2023-06-02 NOTE — Telephone Encounter (Signed)
 Patient hung up before she was connected, attempt to call back , unable to complete as dialed.   Copied from CRM [id number]. Topic: Clinical - Red Word Triage >> Jun 02, 2023 12:11 PM Almira Coaster wrote: [facility] Word that prompted transfer to Nurse Triage: Patient is calling because she is at the imaging facility for a CT scan of the abdomen, patient is having a break down and doesn't want to go through the imaging she doesn't know why she needs it and it is just all to much for her.

## 2023-06-02 NOTE — Telephone Encounter (Signed)
 Nurse triage attempted 3 calls. Unable to reach pt. "Call can not be completed as dialed." Please advise.

## 2023-06-04 ENCOUNTER — Ambulatory Visit
Admission: RE | Admit: 2023-06-04 | Discharge: 2023-06-04 | Disposition: A | Discharge status: Home / Self Care | Source: Ambulatory Visit | Attending: Internal Medicine | Admitting: Internal Medicine

## 2023-06-04 DIAGNOSIS — D7389 Other diseases of spleen: Secondary | ICD-10-CM | POA: Diagnosis not present

## 2023-06-04 DIAGNOSIS — K7460 Unspecified cirrhosis of liver: Secondary | ICD-10-CM | POA: Diagnosis not present

## 2023-06-04 DIAGNOSIS — I700 Atherosclerosis of aorta: Secondary | ICD-10-CM | POA: Diagnosis not present

## 2023-06-04 DIAGNOSIS — R1011 Right upper quadrant pain: Secondary | ICD-10-CM

## 2023-06-04 MED ORDER — IOPAMIDOL (ISOVUE-300) INJECTION 61%
100.0000 mL | Freq: Once | INTRAVENOUS | Status: AC | PRN
  Administered 2023-06-04: 100 mL via INTRAVENOUS

## 2023-06-07 ENCOUNTER — Other Ambulatory Visit: Payer: Self-pay | Admitting: Internal Medicine

## 2023-06-07 ENCOUNTER — Encounter: Payer: Self-pay | Admitting: Internal Medicine

## 2023-06-07 DIAGNOSIS — R748 Abnormal levels of other serum enzymes: Secondary | ICD-10-CM

## 2023-06-07 DIAGNOSIS — K7460 Unspecified cirrhosis of liver: Secondary | ICD-10-CM

## 2023-06-23 IMAGING — CR DG CHEST 2V
2 series · 2 of 2 positions shown · non-contrast
Comparison: Chest x-ray 11/24/2010.

CLINICAL DATA: Cough.

EXAM:
CHEST - 2 VIEW

[w chest pa]
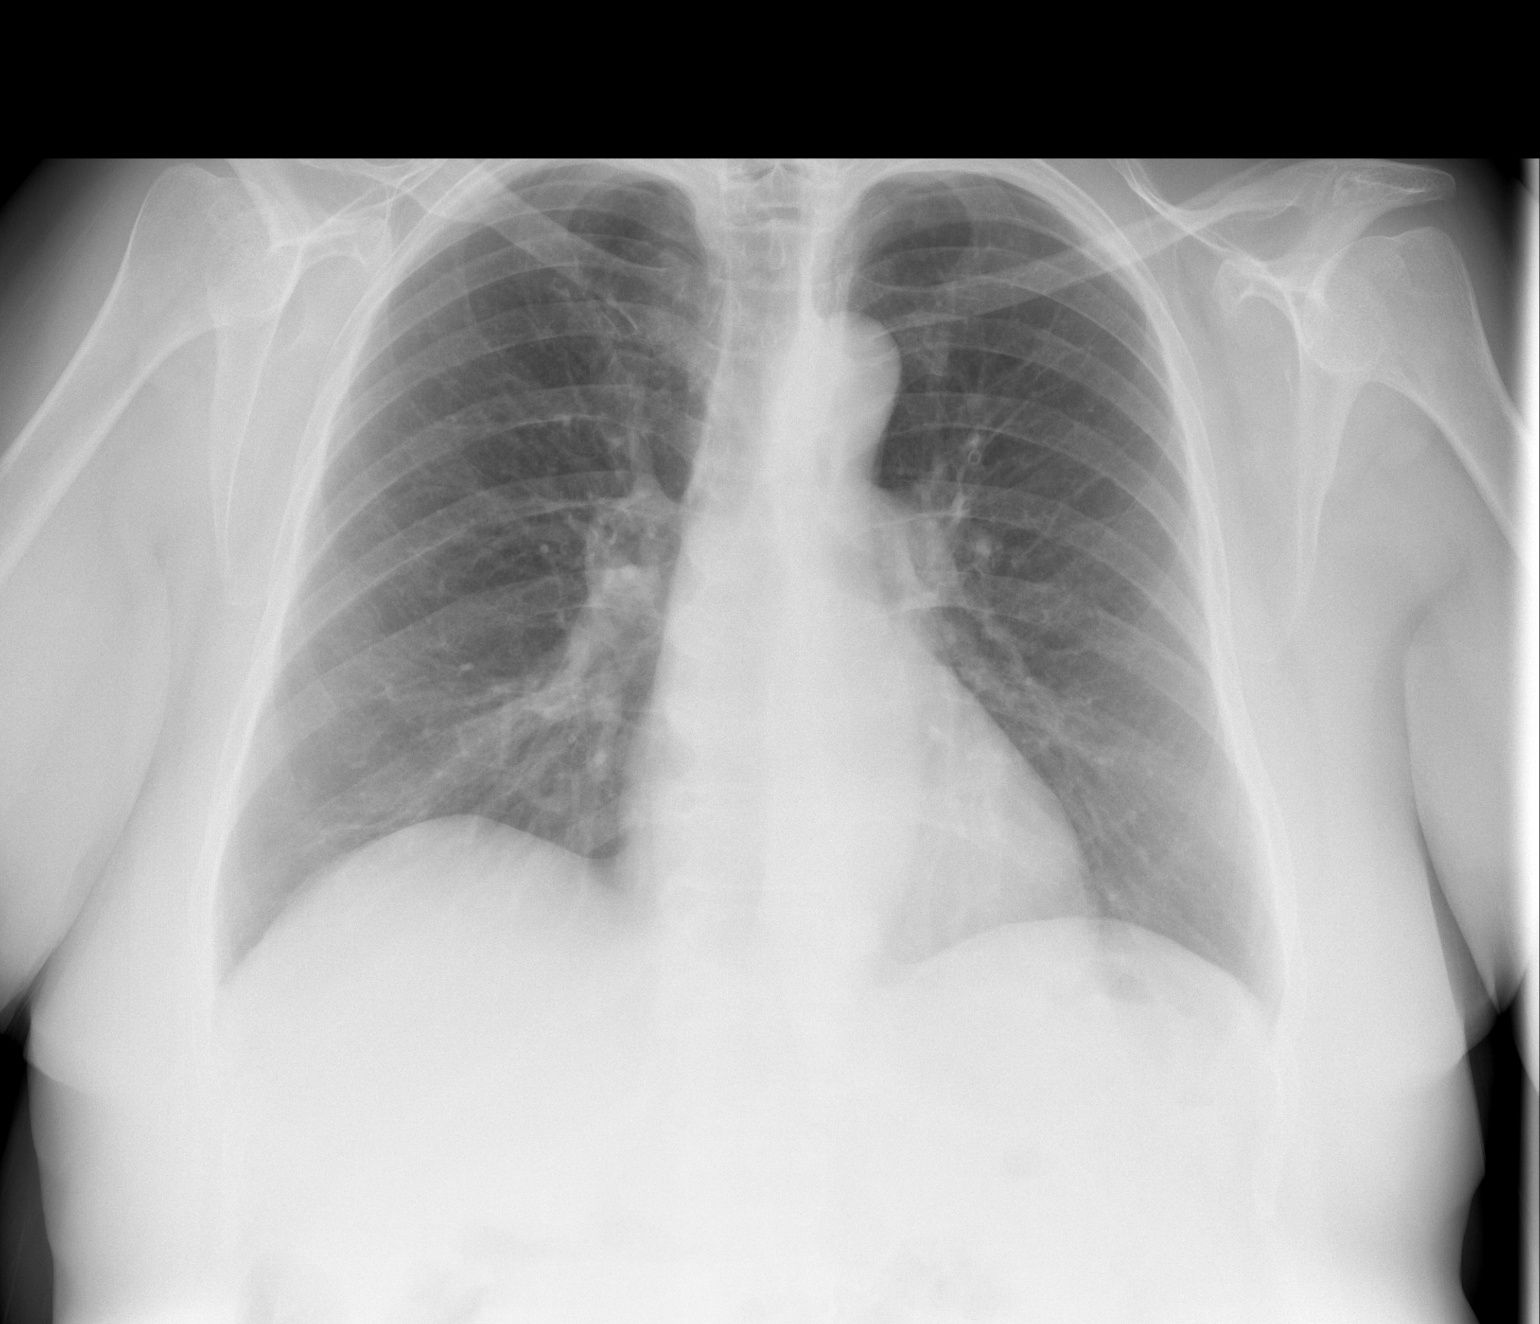

[w chest lat]
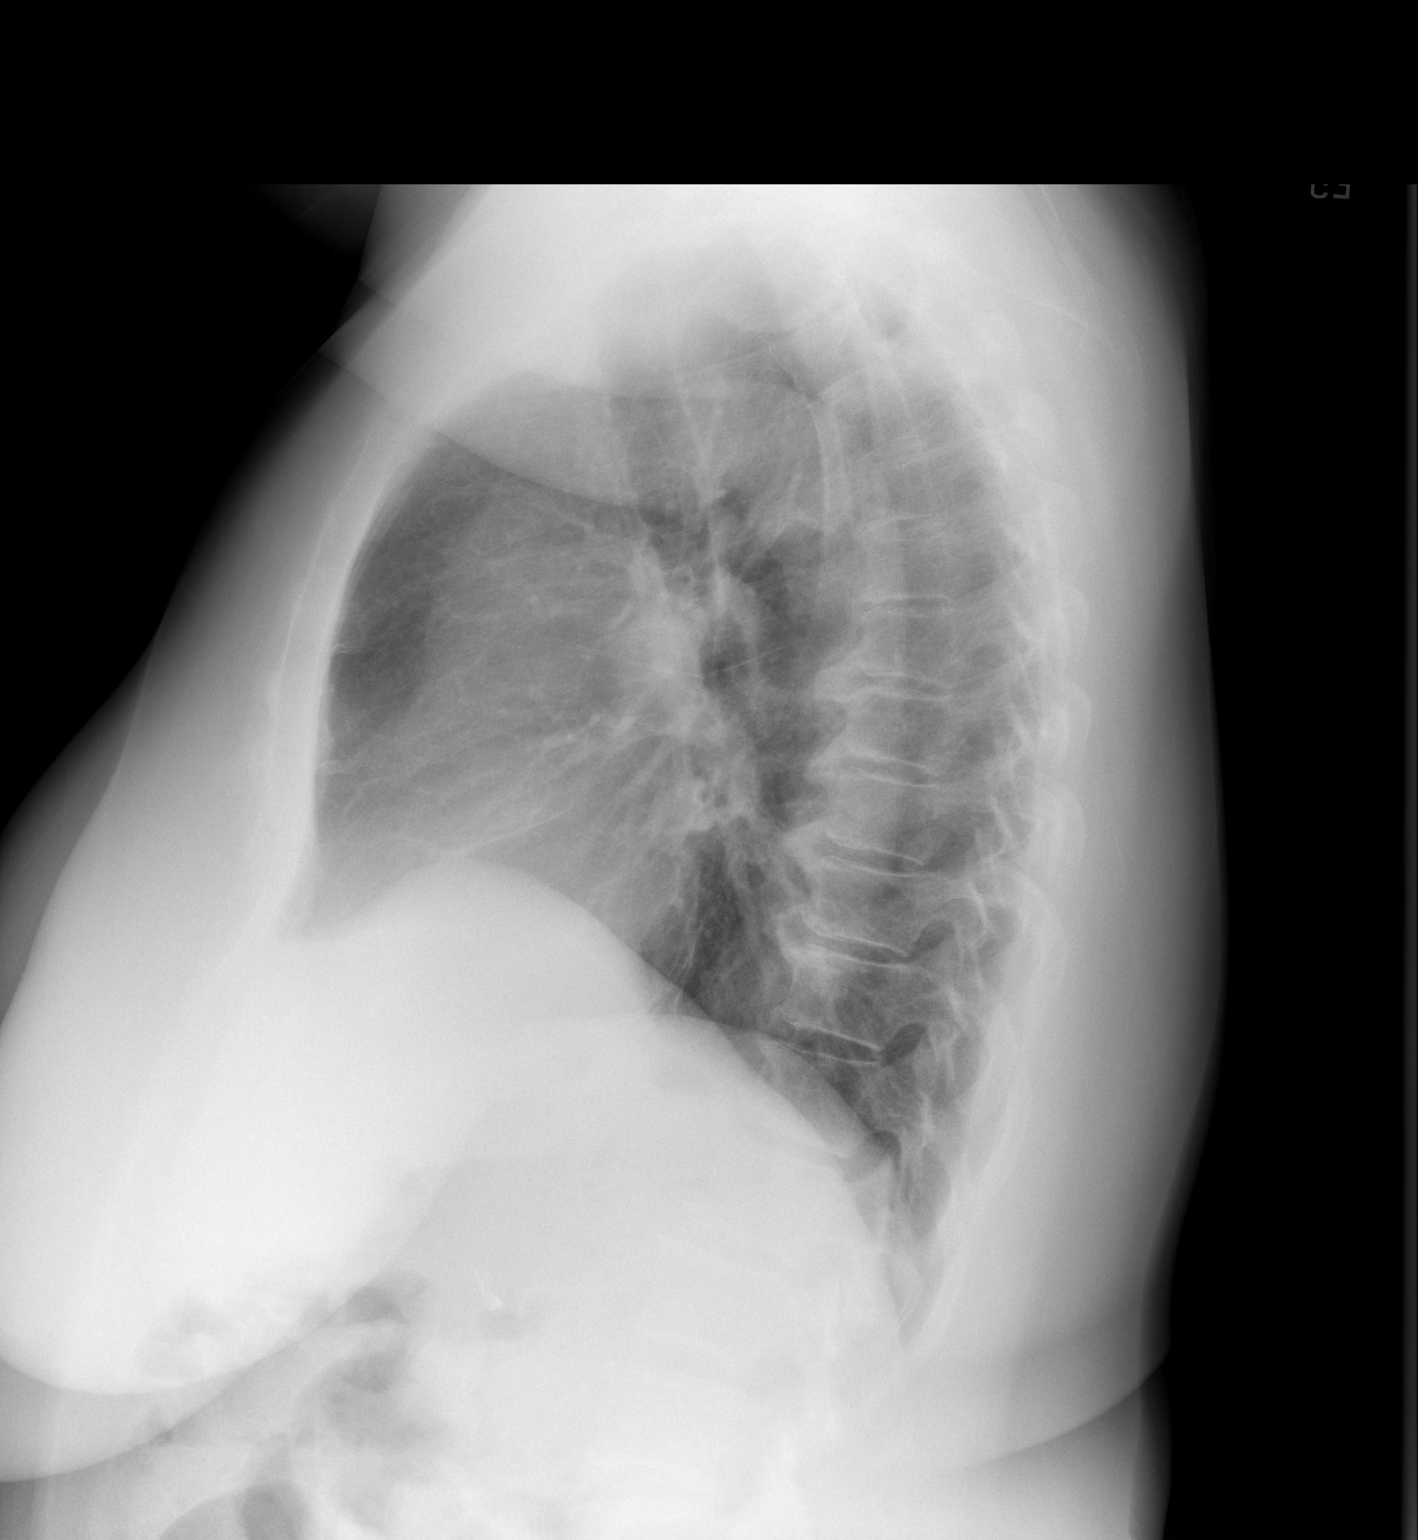

[2 of 2 positions shown; findings below may reference images not displayed]

FINDINGS: No consolidation. No visible pleural effusions or pneumothorax.
Cardiopericardial silhouette is within normal limits. No displaced
fracture. Degenerative changes of the spine. Right upper quadrant
clips.
IMPRESSION: No evidence of acute cardiopulmonary disease.

## 2023-07-14 ENCOUNTER — Other Ambulatory Visit: Payer: Self-pay | Admitting: Internal Medicine

## 2023-08-04 ENCOUNTER — Other Ambulatory Visit: Payer: Self-pay | Admitting: Internal Medicine

## 2023-08-11 ENCOUNTER — Other Ambulatory Visit: Payer: Self-pay

## 2023-08-11 MED ORDER — FLUOXETINE HCL 40 MG PO CAPS
40.0000 mg | ORAL_CAPSULE | Freq: Every day | ORAL | 1 refills | Status: DC
Start: 2023-08-11 — End: 2023-10-19

## 2023-10-04 ENCOUNTER — Other Ambulatory Visit: Payer: Self-pay | Admitting: Internal Medicine

## 2023-10-04 NOTE — Telephone Encounter (Unsigned)
 Copied from CRM [id number]. Topic: Clinical - Medication Refill >> Oct 04, 2023 12:32 PM Berneda FALCON wrote: Medication:  LORazepam  (ATIVAN ) 2 MG tablet    Has the patient contacted their pharmacy? No (Agent: If no, request that the patient contact the pharmacy for the refill. If patient does not wish to contact the pharmacy document the reason why and proceed with request.) (Agent: If yes, when and what did the pharmacy advise?)  This is the patient's preferred pharmacy:  [phone] [address], [place] - [address] [place] [address] Phone: [phone] Fax: [phone]  Is this the correct pharmacy for this prescription? Yes If no, delete pharmacy and type the correct one.   Has the prescription been filled recently? No  Is the patient out of the medication? Yes  Has the patient been seen for an appointment in the last year OR does the patient have an upcoming appointment? Yes  Can we respond through MyChart? Yes  Agent: Please be advised that Rx refills may take up to 3 business days. We ask that you follow-up with your pharmacy.

## 2023-10-18 ENCOUNTER — Ambulatory Visit: Payer: Self-pay

## 2023-10-18 NOTE — Telephone Encounter (Signed)
  Pt refused triage. Pt sounds upset/crying on phone. Pt just requested appt. Appt made for tomorrow morning.      Copied from CRM [id number]. Topic: Clinical - Red Word Triage >> Oct 18, 2023 10:40 AM Frederich PARAS wrote: [facility] that prompted transfer to Nurse Triage: Confusion  PT says she is tired all the time, confused, overwhelmed, emotional, not getting rest , unmotivated, pt wantts to know whats going on

## 2023-10-19 ENCOUNTER — Encounter: Payer: Self-pay | Admitting: Internal Medicine

## 2023-10-19 ENCOUNTER — Ambulatory Visit: Admitting: Internal Medicine

## 2023-10-19 VITALS — Ht 66.0 in

## 2023-10-19 DIAGNOSIS — K7460 Unspecified cirrhosis of liver: Secondary | ICD-10-CM

## 2023-10-19 DIAGNOSIS — F419 Anxiety disorder, unspecified: Secondary | ICD-10-CM | POA: Diagnosis not present

## 2023-10-19 DIAGNOSIS — E538 Deficiency of other specified B group vitamins: Secondary | ICD-10-CM

## 2023-10-19 DIAGNOSIS — E038 Other specified hypothyroidism: Secondary | ICD-10-CM

## 2023-10-19 DIAGNOSIS — G4700 Insomnia, unspecified: Secondary | ICD-10-CM | POA: Insufficient documentation

## 2023-10-19 DIAGNOSIS — F3341 Major depressive disorder, recurrent, in partial remission: Secondary | ICD-10-CM

## 2023-10-19 DIAGNOSIS — F5104 Psychophysiologic insomnia: Secondary | ICD-10-CM | POA: Diagnosis not present

## 2023-10-19 DIAGNOSIS — R4189 Other symptoms and signs involving cognitive functions and awareness: Secondary | ICD-10-CM | POA: Insufficient documentation

## 2023-10-19 DIAGNOSIS — R748 Abnormal levels of other serum enzymes: Secondary | ICD-10-CM

## 2023-10-19 MED ORDER — AMPHETAMINE-DEXTROAMPHETAMINE 5 MG PO TABS: 5.0000 mg | ORAL_TABLET | Freq: Two times a day (BID) | ORAL | 0 refills | Status: DC

## 2023-10-19 MED ORDER — ESCITALOPRAM OXALATE 20 MG PO TABS: 20.0000 mg | ORAL_TABLET | Freq: Every day | ORAL | 5 refills | Status: DC

## 2023-10-19 NOTE — Assessment & Plan Note (Addendum)
 Abstain from alcohol.  Obtain liver function test, ammonia Stop drinking beer

## 2023-10-19 NOTE — Assessment & Plan Note (Addendum)
 Pt has stated she has been having an increase in crying daily, confusion, worrying about her son and mother health. Pt is also afraid of driving now, pt is not sleeping and having an increase in brain fog, unable to have bowel movements, not eating. Just an overall feeling of sadness is very HIGH.) She has been overwhelmed with taking care of her mother who is 84 and awaiting hip surgery for August 20th.  Joy and Gwendlyn live w/her C/o brain fog all the time, can't think... - not sleeping, unable to concentrate when the working, driving She has started to drink beer 1 a day at night unfortunately.   Discontinue fluoxetine  in 1 weeks.  Start Lexapro  instead Psychology referral

## 2023-10-19 NOTE — Assessment & Plan Note (Addendum)
 Worse, situational Pt has stated she has been having an increase in crying daily, confusion, worrying about her son and mother health. Pt is also afraid of driving now, pt is not sleeping and having an increase in brain fog, unable to have bowel movements, not eating. Just an overall feeling of sadness is very HIGH.) She has been overwhelmed with taking care of her mother who is 23 and awaiting hip surgery for August 20th.  Margaret Preston and Margaret Preston live w/her C/o brain fog all the time, can't think... - not sleeping, unable to concentrate when the working, driving She has started to drink beer 1 a day at night unfortunately.   Discontinue fluoxetine  in 1 weeks.  Start Lexapro  instead Psychology referral  Return to clinic in 1 month

## 2023-10-19 NOTE — Assessment & Plan Note (Addendum)
 Obtain liver function tests, ammonia

## 2023-10-19 NOTE — Assessment & Plan Note (Signed)
 Free T4 is low normal.  TSH is elevated.  Free T3 is decreased. Will increase levothyroxine to 75 mcg a day.  Repeat labs in 2-3 months

## 2023-10-19 NOTE — Assessment & Plan Note (Addendum)
 Worse Change

## 2023-10-19 NOTE — Progress Notes (Signed)
 Subjective:  Patient ID: Margaret Preston, female    DOB: 04-09-1960  Age: 63 y.o. MRN: [id number]  CC: Anxiety (Pt has stated she has been having an increase in crying daily, confusion, worrying about her son and mother health. Pt is also afraid of driving now, pt is not sleeping and having an increase in brain fog, unable to have bowel movements, not eating. Just an overall feeling of sadness is very HIGH.)   HPI Margaret Preston presents for anxiety (Pt has stated she has been having an increase in crying daily, confusion, worrying about her son and mother health. Pt is also afraid of driving now, pt is not sleeping and having an increase in brain fog, unable to have bowel movements, not eating. Just an overall feeling of sadness is very HIGH.) She has been overwhelmed with taking care of her mother who is 77 and awaiting hip surgery for August 20th.  Joy and Gwendlyn live w/her C/o brain fog all the time, can't think... - not sleeping, unable to concentrate when the working, driving She has started to drink beer 1 a day at night unfortunately.    Outpatient Medications Prior to Visit  Medication Sig Dispense Refill   conjugated estrogens  (PREMARIN ) vaginal cream Place 1 Applicatorful vaginally daily. 42.5 g 5   hydrocodone -ibuprofen  (VICOPROFEN ) 5-200 MG tablet Take 1 tablet by mouth every 8 (eight) hours as needed for pain. 15 tablet 0   ibuprofen  (ADVIL ) 600 MG tablet Take 600 mg by mouth every 6 (six) hours as needed.     levothyroxine  (SYNTHROID ) 75 MCG tablet Take 1 tablet (75 mcg total) by mouth daily. 30 tablet 5   LORazepam  (ATIVAN ) 2 MG tablet TAKE 1/2 TO 1 TABLET BY MOUTH TWICE A DAY AS NEEDED 60 tablet 1   FLUoxetine  (PROZAC ) 40 MG capsule Take 1 capsule (40 mg total) by mouth daily. 90 capsule 1   cyanocobalamin  1000 MCG tablet Take 0.5 tablets (500 mcg total) by mouth daily. (Patient not taking: Reported on 10/19/2023) 100 tablet 3   No facility-administered medications  prior to visit.    ROS: Review of Systems  Constitutional:  Positive for fatigue and malaise/fatigue. Negative for activity change, appetite change, chills and unexpected weight change.  HENT:  Negative for congestion, mouth sores and sinus pressure.   Eyes:  Negative for visual disturbance.  Respiratory:  Negative for cough and chest tightness.   Gastrointestinal:  Negative for abdominal pain and nausea.  Endocrine: Negative for polydipsia.  Genitourinary:  Negative for difficulty urinating, frequency and vaginal pain.  Musculoskeletal:  Negative for back pain and gait problem.  Skin:  Negative for pallor and rash.  Neurological:  Positive for dizziness. Negative for tremors, seizures, weakness, numbness and headaches.  Psychiatric/Behavioral:  Positive for decreased concentration, depression, dysphoric mood, memory loss and sleep disturbance. Negative for confusion, hallucinations, substance abuse and suicidal ideas. The patient is nervous/anxious and has insomnia.     Objective:  Ht 5' 6 (1.676 m)   LMP 05/08/2014   BMI 38.09 kg/m   BP Readings from Last 3 Encounters:  05/07/23 120/80  11/05/22 110/60  09/24/22 122/70    Wt Readings from Last 3 Encounters:  05/07/23 236 lb (107 kg)  11/05/22 235 lb (106.6 kg)  09/24/22 230 lb (104.3 kg)    Physical Exam Constitutional:      General: She is not in acute distress.    Appearance: She is well-developed. She is obese.  HENT:     Head:  Normocephalic.     Right Ear: External ear normal.     Left Ear: External ear normal.     Nose: Nose normal.  Eyes:     General:        Right eye: No discharge.        Left eye: No discharge.     Conjunctiva/sclera: Conjunctivae normal.     Pupils: Pupils are equal, round, and reactive to light.  Neck:     Thyroid : No thyromegaly.     Vascular: No JVD.     Trachea: No tracheal deviation.  Cardiovascular:     Rate and Rhythm: Normal rate and regular rhythm.     Heart sounds: Normal  heart sounds.  Pulmonary:     Effort: No respiratory distress.     Breath sounds: No stridor. No wheezing.  Abdominal:     General: Bowel sounds are normal. There is no distension.     Palpations: Abdomen is soft. There is no mass.     Tenderness: There is no abdominal tenderness. There is no guarding or rebound.  Musculoskeletal:        General: No tenderness.     Cervical back: Normal range of motion and neck supple. No rigidity.  Lymphadenopathy:     Cervical: No cervical adenopathy.  Skin:    Findings: No erythema or rash.  Neurological:     Cranial Nerves: No cranial nerve deficit.     Motor: No abnormal muscle tone.     Coordination: Coordination normal.     Deep Tendon Reflexes: Reflexes normal.  Psychiatric:        Behavior: Behavior normal.        Thought Content: Thought content normal.        Judgment: Judgment normal.   Tearful, sad, upset No suicidal    A total time of 45 minutes was spent preparing to see the patient, reviewing tests, and other medical records.  Also, obtaining history and performing comprehensive physical exam.  Additionally, counseling the patient regarding the above listed issues -depression, anxiety, liver cirrhosis, brain fog.   Finally, documenting clinical information in the health records, coordination of care, educating the patient. It is a complex case.   Lab Results  Component Value Date   WBC 6.3 09/21/2022   HGB 12.6 09/21/2022   HCT 39.1 09/21/2022   PLT 406.0 (H) 09/21/2022   GLUCOSE 93 05/07/2023   CHOL 138 09/21/2022   TRIG 46.0 09/21/2022   HDL 56.40 09/21/2022   LDLCALC 72 09/21/2022   ALT 22 05/07/2023   AST 28 05/07/2023   NA 139 05/07/2023   K 3.8 05/07/2023   CL 103 05/07/2023   CREATININE 0.83 05/07/2023   BUN 10 05/07/2023   CO2 28 05/07/2023   TSH 6.98 (H) 05/07/2023   INR 1.1 (H) 11/04/2020   HGBA1C 5.9 02/28/2013    CT ABDOMEN PELVIS W CONTRAST Result Date: 06/04/2023 CLINICAL DATA:  Epigastric pain.   Right flank pain EXAM: CT ABDOMEN AND PELVIS WITH CONTRAST TECHNIQUE: Multidetector CT imaging of the abdomen and pelvis was performed using the standard protocol following bolus administration of intravenous contrast. RADIATION DOSE REDUCTION: This exam was performed according to the departmental dose-optimization program which includes automated exposure control, adjustment of the mA and/or kV according to patient size and/or use of iterative reconstruction technique. CONTRAST:  ISOVUE -300 IOPAMIDOL  (ISOVUE -300) INJECTION 61% COMPARISON:  CT 06/23/2017, ultrasound 11/08/2020 FINDINGS: Lower chest: No acute abnormality. Hepatobiliary: Cirrhotic liver morphology with surface nodularity.  No focal liver lesion is identified. Prior cholecystectomy. No biliary dilatation. Pancreas: Unremarkable. No pancreatic ductal dilatation or surrounding inflammatory changes. Spleen: Rounded solid mass arising from the inferior pole of the spleen measuring 3.8 x 3.8 cm in size (previously 3.2 cm in 2019). Adrenals/Urinary Tract: Unremarkable adrenal glands. Kidneys enhance symmetrically without focal lesion, stone, or hydronephrosis. Ureters are nondilated. Urinary bladder appears unremarkable for the degree of distention. 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 No dilated loops of bowel. Surgically absent appendix. No focal bowel wall thickening or inflammatory changes. Vascular/Lymphatic: Scattered aortoiliac atherosclerotic calcifications without aneurysm. No abdominopelvic lymphadenopathy. Reproductive: Uterus and bilateral adnexa are unremarkable. Other: No free fluid. No abdominopelvic fluid collection. No pneumoperitoneum. No abdominal wall hernia. 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 Scattered areas of lucency within the bony pelvis are nonspecific but favored to represent benign fibro-osseous  lesions. No acute bony abnormality. IMPRESSION: 1. No acute abdominopelvic findings. 2. Cirrhotic liver morphology. 3. Rounded solid mass arising from the inferior pole of the spleen measuring 3.8 cm in size (previously 3.2 cm in 2019). This is favored to represent a benign lesion such as a hemangioma or hamartoma. This could be further evaluated with contrast enhanced MRI of the abdomen, if not previously characterized elsewhere. 4. 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 Scattered areas of lucency within the bony pelvis are nonspecific but favored to represent benign fibro-osseous lesions. Metastatic disease or myeloma would be difficult to exclude. Correlate for history of malignancy and consider follow-up with MRI, as clinically warranted. 5. Aortic atherosclerosis (ICD10-I70.0). Electronically Signed   By: Mabel Converse D.O.   On: 06/04/2023 14:12    Assessment & Plan:   Problem List Items Addressed This Visit     Anxiety   Pt has stated she has been having an increase in crying daily, confusion, worrying about her son and mother health. Pt is also afraid of driving now, pt is not sleeping and having an increase in brain fog, unable to have bowel movements, not eating. Just an overall feeling of sadness is very HIGH.) She has been overwhelmed with taking care of her mother who is 55 and awaiting hip surgery for August 20th.  Joy and Gwendlyn live w/her C/o brain fog all the time, can't think... - not sleeping, unable to concentrate when the working, driving She has started to drink beer 1 a day at night unfortunately.   Discontinue fluoxetine  in 1 weeks.  Start Lexapro  instead Psychology referral      Relevant Medications   escitalopram  (LEXAPRO ) 20 MG tablet   Other Relevant Orders   Ambulatory referral to Psychology   B12 deficiency   Continue on B12      Brain fog   Brain fog and apathy. Obtain  lab work. Start low-dose Adderall  Potential benefits of a long term amphetamines  use as well as potential risks  and complications were explained to the patient and were aknowledged.       Depression - Primary   Worse, situational Pt has stated she has been having an increase in crying daily, confusion, worrying about her son and mother health. Pt is also afraid of driving now, pt is not sleeping and having an increase in brain fog, unable to have bowel movements, not eating. Just an overall feeling of sadness is very HIGH.) She has been overwhelmed with taking care of her mother who is 3  and awaiting hip surgery for August 20th.  Joy and Gwendlyn live w/her C/o brain fog all the time, can't think... - not sleeping, unable to concentrate when the working, driving She has started to drink beer 1 a day at night unfortunately.   Discontinue fluoxetine  in 1 weeks.  Start Lexapro  instead Psychology referral  Return to clinic in 1 month      Relevant Medications   escitalopram  (LEXAPRO ) 20 MG tablet   Other Relevant Orders   Ambulatory referral to Psychology   Elevated liver enzymes   Obtain liver function tests, ammonia      Hypothyroidism   Free T4 is low normal.  TSH is elevated.  Free T3 is decreased. Will increase levothyroxine  to 75 mcg a day.  Repeat labs in 2-3 months      Insomnia   Worse Change        Liver cirrhosis (HCC)   Abstain from alcohol.  Obtain liver function test, ammonia Stop drinking beer      Obesity, morbid (HCC)   Wt Readings from Last 3 Encounters:  05/07/23 236 lb (107 kg)  11/05/22 235 lb (106.6 kg)  09/24/22 230 lb (104.3 kg)         Relevant Medications   amphetamine -dextroamphetamine  (ADDERALL) 5 MG tablet      Meds ordered this encounter  Medications   escitalopram  (LEXAPRO ) 20 MG tablet    Sig: Take 1 tablet (20 mg total) by mouth daily.    Dispense:  30 tablet    Refill:  5   amphetamine -dextroamphetamine  (ADDERALL) 5 MG  tablet    Sig: Take 1 tablet (5 mg total) by mouth 2 (two) times daily with a meal. Breakfast and lunch    Dispense:  60 tablet    Refill:  0      Follow-up: Return in about 4 weeks (around 11/16/2023) for a follow-up visit.Virtual Visit via Video Note  I connected with Margaret Preston on 10/19/23 at  7:50 AM EDT by a video enabled telemedicine application and verified that I am speaking with the correct person using two identifiers.   I discussed the limitations of evaluation and management by telemedicine and the availability of in person appointments. The patient expressed understanding and agreed to proceed.  I was located at our [facility] office. The patient was at home. There was no one else present in the visit.  Chief Complaint  Patient presents with   Anxiety    Pt has stated she has been having an increase in crying daily, confusion, worrying about her son and mother health. Pt is also afraid of driving now, pt is not sleeping and having an increase in brain fog, unable to have bowel movements, not eating. Just an overall feeling of sadness is very HIGH.     History of Present Illness:   Review of Systems  Constitutional:  Positive for fatigue and malaise/fatigue. Negative for activity change, appetite change, chills and unexpected weight change.  HENT:  Negative for congestion, mouth sores and sinus pressure.   Eyes:  Negative for visual disturbance.  Respiratory:  Negative for cough and chest tightness.   Gastrointestinal:  Negative for abdominal pain and nausea.  Genitourinary:  Negative for difficulty urinating, frequency and vaginal pain.  Musculoskeletal:  Negative for back pain and gait problem.  Skin:  Negative for pallor and rash.  Neurological:  Positive for dizziness. Negative for tremors, seizures, weakness, numbness and headaches.  Endo/Heme/Allergies:  Negative for polydipsia.  Psychiatric/Behavioral:  Positive for decreased concentration, depression,  dysphoric mood, memory loss and sleep disturbance. Negative for confusion, hallucinations, substance abuse and suicidal ideas. The patient is nervous/anxious and has insomnia.      Observations/Objective: The patient appears to be in no acute distress  Assessment and Plan:  Problem List Items Addressed This Visit     Anxiety   Pt has stated she has been having an increase in crying daily, confusion, worrying about her son and mother health. Pt is also afraid of driving now, pt is not sleeping and having an increase in brain fog, unable to have bowel movements, not eating. Just an overall feeling of sadness is very HIGH.) She has been overwhelmed with taking care of her mother who is 47 and awaiting hip surgery for August 20th.  Joy and Gwendlyn live w/her C/o brain fog all the time, can't think... - not sleeping, unable to concentrate when the working, driving She has started to drink beer 1 a day at night unfortunately.   Discontinue fluoxetine  in 1 weeks.  Start Lexapro  instead Psychology referral      Relevant Medications   escitalopram  (LEXAPRO ) 20 MG tablet   Other Relevant Orders   Ambulatory referral to Psychology   B12 deficiency   Continue on B12      Brain fog   Brain fog and apathy. Obtain lab work. Start low-dose Adderall  Potential benefits of a long term amphetamines  use as well as potential risks  and complications were explained to the patient and were aknowledged.       Depression - Primary   Worse, situational Pt has stated she has been having an increase in crying daily, confusion, worrying about her son and mother health. Pt is also afraid of driving now, pt is not sleeping and having an increase in brain fog, unable to have bowel movements, not eating. Just an overall feeling of sadness is very HIGH.) She has been overwhelmed with taking care of her mother who is 20 and awaiting hip surgery for August 20th.  Joy and Gwendlyn live w/her C/o brain fog all the  time, can't think... - not sleeping, unable to concentrate when the working, driving She has started to drink beer 1 a day at night unfortunately.   Discontinue fluoxetine  in 1 weeks.  Start Lexapro  instead Psychology referral  Return to clinic in 1 month      Relevant Medications   escitalopram  (LEXAPRO ) 20 MG tablet   Other Relevant Orders   Ambulatory referral to Psychology   Elevated liver enzymes   Obtain liver function tests, ammonia      Hypothyroidism   Free T4 is low normal.  TSH is elevated.  Free T3 is decreased. Will increase levothyroxine  to 75 mcg a day.  Repeat labs in 2-3 months      Insomnia   Worse Change        Liver cirrhosis (HCC)   Abstain from alcohol.  Obtain liver function test, ammonia Stop drinking beer      Obesity, morbid (HCC)   Wt Readings from Last 3 Encounters:  05/07/23 236 lb (107 kg)  11/05/22 235 lb (106.6 kg)  09/24/22 230 lb (104.3 kg)         Relevant Medications   amphetamine -dextroamphetamine  (ADDERALL) 5 MG tablet     Meds ordered this encounter  Medications   escitalopram  (LEXAPRO ) 20 MG tablet    Sig: Take 1 tablet (20 mg total) by mouth daily.    Dispense:  30 tablet    Refill:  5   amphetamine -dextroamphetamine  (ADDERALL) 5 MG tablet    Sig: Take 1 tablet (5 mg total) by mouth 2 (two) times daily with a meal. Breakfast and lunch    Dispense:  60 tablet    Refill:  0     Follow Up Instructions:    I discussed the assessment and treatment plan with the patient. The patient was provided an opportunity to ask questions and all were answered. The patient agreed with the plan and demonstrated an understanding of the instructions.   The patient was advised to call back or seek an in-person evaluation if the symptoms worsen or if the condition fails to improve as anticipated.  I provided face-to-face time during this encounter. We were at different locations.   Marolyn Noel, MD   Marolyn Noel, MD

## 2023-10-19 NOTE — Assessment & Plan Note (Signed)
Continue on B12 

## 2023-10-19 NOTE — Assessment & Plan Note (Signed)
 Brain fog and apathy. Obtain lab work. Start low-dose Adderall  Potential benefits of a long term amphetamines  use as well as potential risks  and complications were explained to the patient and were aknowledged.

## 2023-10-19 NOTE — Assessment & Plan Note (Signed)
 Wt Readings from Last 3 Encounters:  05/07/23 236 lb (107 kg)  11/05/22 235 lb (106.6 kg)  09/24/22 230 lb (104.3 kg)

## 2023-11-10 ENCOUNTER — Other Ambulatory Visit: Payer: Self-pay | Admitting: Internal Medicine

## 2023-11-26 ENCOUNTER — Other Ambulatory Visit: Payer: Self-pay | Admitting: Internal Medicine

## 2023-11-26 DIAGNOSIS — E038 Other specified hypothyroidism: Secondary | ICD-10-CM

## 2023-11-26 NOTE — Telephone Encounter (Signed)
 Copied from CRM [id number]. Topic: Clinical - Medication Refill >> Nov 26, 2023 10:20 AM Viola F wrote: Medication: levothyroxine  (SYNTHROID ) 75 MCG tablet [[id number]] amphetamine -dextroamphetamine  (ADDERALL) 5 MG tablet [[id number]]  Has the patient contacted their pharmacy? Yes (Agent: If no, request that the patient contact the pharmacy for the refill. If patient does not wish to contact the pharmacy document the reason why and proceed with request.) (Agent: If yes, when and what did the pharmacy advise?)  This is the patient's preferred pharmacy:   [phone] [address] [place] - [address] [place] [address] Phone: [phone] Fax: [phone]  Is this the correct pharmacy for this prescription? Yes If no, delete pharmacy and type the correct one.   Has the prescription been filled recently? Yes  Is the patient out of the medication? No, 1 more Adderall, and 8 more of the Levothyroxine    Has the patient been seen for an appointment in the last year OR does the patient have an upcoming appointment? Yes  Can we respond through MyChart? Yes  Agent: Please be advised that Rx refills may take up to 3 business days. We ask that you follow-up with your pharmacy.

## 2023-11-29 MED ORDER — LEVOTHYROXINE SODIUM 75 MCG PO TABS: 75.0000 ug | ORAL_TABLET | Freq: Every day | ORAL | 5 refills | Status: AC

## 2023-11-29 MED ORDER — AMPHETAMINE-DEXTROAMPHETAMINE 5 MG PO TABS: 5.0000 mg | ORAL_TABLET | Freq: Two times a day (BID) | ORAL | 0 refills | Status: AC

## 2023-12-08 ENCOUNTER — Telehealth: Payer: Self-pay

## 2023-12-08 NOTE — Telephone Encounter (Signed)
 Copied from CRM #[id number]. Topic: Clinical - Medication Prior Auth >> Dec 08, 2023  9:39 AM Pinkey ORN wrote: Reason for CRM: amphetamine -dextroamphetamine  (ADDERALL) 5 MG tablet >> Dec 08, 2023  9:39 AM Pinkey ORN wrote: Patient states her insurance is needing an prior authorization for her amphetamine -dextroamphetamine  (ADDERALL) 5 MG tablet

## 2023-12-08 NOTE — Telephone Encounter (Signed)
 Copied from CRM [id number]. Topic: Referral - Status >> Dec 08, 2023  9:37 AM Pinkey ORN wrote: Reason for CRM: Psychology >> Dec 08, 2023  9:38 AM Pinkey ORN wrote: Patient is calling, wanting to know the status of her referral to psychology. Please follow up with patient.

## 2023-12-09 ENCOUNTER — Telehealth: Payer: Self-pay

## 2023-12-09 ENCOUNTER — Other Ambulatory Visit (HOSPITAL_COMMUNITY): Payer: Self-pay

## 2023-12-09 NOTE — Telephone Encounter (Signed)
 Pharmacy Patient Advocate Encounter   Received notification from Pt Calls Messages that prior authorization for Amphetamine -Dextroamphetamine  5MG  tablets  is required/requested.   Insurance verification completed.   The patient is insured through [facility].   Per test claim: PA required; PA submitted to above mentioned insurance via Latent Key/confirmation #/EOC [id number] Status is pending

## 2023-12-09 NOTE — Telephone Encounter (Signed)
 Forwarded another chat to prior team regarding this.

## 2023-12-09 NOTE — Telephone Encounter (Signed)
 Copied from CRM [id number]. Topic: Referral - Status >> Dec 09, 2023  9:51 AM Margaret Preston wrote: Reason for CRM: Pt called in wanting an update on the referral to a psychologist that was put in Aug 12th. Informed pt it generally can take up to 7-10 business days. Per pt chart its been a month and the only status the referral has is pending. There has not been an update since the day it was put in.

## 2023-12-10 ENCOUNTER — Other Ambulatory Visit (HOSPITAL_COMMUNITY): Payer: Self-pay

## 2023-12-10 NOTE — Telephone Encounter (Signed)
 Pharmacy Patient Advocate Encounter  Received notification from [facility] that Prior Authorization for Amphetamine -Dextroamphetamine  5mg  tabs has been DENIED.  See denial reason below. No denial letter attached in CMM. Will attach denial letter to Media tab once received.   PA #/Case ID/Reference #: [id number]

## 2023-12-15 ENCOUNTER — Telehealth: Payer: Self-pay

## 2023-12-15 NOTE — Telephone Encounter (Signed)
 Copied from CRM [id number]. Topic: Clinical - Medication Prior Auth >> Dec 15, 2023 12:22 PM [place] wrote: Reason for CRM: Patient calling to check on PA for Adderall. Please contact patient as soon as possible

## 2023-12-16 NOTE — Telephone Encounter (Signed)
 Called and spoke with patient, informing her of medication being denied. Medication is currently filled at the pharmacy. Patient will check with them to see what next steps are and if appeal is possible

## 2024-02-06 ENCOUNTER — Other Ambulatory Visit: Payer: Self-pay | Admitting: Internal Medicine
